# Patient Record
Sex: Male | Born: 1960 | Race: White | Hispanic: No | Marital: Married | Smoking: Former smoker
Health system: Southern US, Community
[De-identification: ages and names within clinical notes are randomized; demographics above are authoritative.]

## PROBLEM LIST (undated history)

## (undated) DIAGNOSIS — F1910 Other psychoactive substance abuse, uncomplicated: Secondary | ICD-10-CM

## (undated) DIAGNOSIS — I213 ST elevation (STEMI) myocardial infarction of unspecified site: Secondary | ICD-10-CM

## (undated) DIAGNOSIS — M1990 Unspecified osteoarthritis, unspecified site: Secondary | ICD-10-CM

## (undated) DIAGNOSIS — N200 Calculus of kidney: Secondary | ICD-10-CM

## (undated) DIAGNOSIS — I10 Essential (primary) hypertension: Secondary | ICD-10-CM

## (undated) DIAGNOSIS — E785 Hyperlipidemia, unspecified: Secondary | ICD-10-CM

## (undated) HISTORY — DX: Other psychoactive substance abuse, uncomplicated: F19.10

## (undated) HISTORY — DX: Calculus of kidney: N20.0

---

## 2002-04-20 ENCOUNTER — Emergency Department (HOSPITAL_COMMUNITY): Admission: EM | Admit: 2002-04-20 | Discharge: 2002-04-20 | Payer: Self-pay | Admitting: Emergency Medicine

## 2002-04-24 ENCOUNTER — Emergency Department (HOSPITAL_COMMUNITY): Admission: EM | Admit: 2002-04-24 | Discharge: 2002-04-24 | Payer: Self-pay | Admitting: Emergency Medicine

## 2002-04-29 ENCOUNTER — Emergency Department (HOSPITAL_COMMUNITY): Admission: EM | Admit: 2002-04-29 | Discharge: 2002-04-29 | Payer: Self-pay | Admitting: Emergency Medicine

## 2004-07-01 ENCOUNTER — Emergency Department (HOSPITAL_COMMUNITY): Admission: EM | Admit: 2004-07-01 | Discharge: 2004-07-01 | Payer: Self-pay | Admitting: Emergency Medicine

## 2006-11-12 ENCOUNTER — Emergency Department (HOSPITAL_COMMUNITY): Admission: EM | Admit: 2006-11-12 | Discharge: 2006-11-12 | Payer: Self-pay | Admitting: Emergency Medicine

## 2008-09-09 ENCOUNTER — Emergency Department (HOSPITAL_COMMUNITY): Admission: EM | Admit: 2008-09-09 | Discharge: 2008-09-09 | Payer: Self-pay | Admitting: Emergency Medicine

## 2008-10-26 ENCOUNTER — Ambulatory Visit: Payer: Self-pay | Admitting: Family Medicine

## 2008-10-26 DIAGNOSIS — M545 Low back pain: Secondary | ICD-10-CM

## 2008-10-26 LAB — CONVERTED CEMR LAB
ALT: 16 units/L (ref 0–53)
AST: 14 units/L (ref 0–37)
Albumin: 4.2 g/dL (ref 3.5–5.2)
Alkaline Phosphatase: 55 units/L (ref 39–117)
BUN: 15 mg/dL (ref 6–23)
Basophils Absolute: 0 10*3/uL (ref 0.0–0.1)
Basophils Relative: 0 % (ref 0–1)
CO2: 24 meq/L (ref 19–32)
Calcium: 8.6 mg/dL (ref 8.4–10.5)
Chloride: 105 meq/L (ref 96–112)
Creatinine, Ser: 0.96 mg/dL (ref 0.40–1.50)
Eosinophils Absolute: 0.3 10*3/uL (ref 0.0–0.7)
Eosinophils Relative: 3 % (ref 0–5)
Glucose, Bld: 84 mg/dL (ref 70–99)
HCT: 47.1 % (ref 39.0–52.0)
Hemoglobin: 15.5 g/dL (ref 13.0–17.0)
Lymphocytes Relative: 40 % (ref 12–46)
Lymphs Abs: 3.6 10*3/uL (ref 0.7–4.0)
MCHC: 32.9 g/dL (ref 30.0–36.0)
MCV: 90.8 fL (ref 78.0–100.0)
Monocytes Absolute: 0.7 10*3/uL (ref 0.1–1.0)
Monocytes Relative: 7 % (ref 3–12)
Neutro Abs: 4.5 10*3/uL (ref 1.7–7.7)
Neutrophils Relative %: 50 % (ref 43–77)
Platelets: 216 10*3/uL (ref 150–400)
Potassium: 5 meq/L (ref 3.5–5.3)
RBC: 5.19 M/uL (ref 4.22–5.81)
RDW: 14.4 % (ref 11.5–15.5)
Sodium: 143 meq/L (ref 135–145)
Total Bilirubin: 0.4 mg/dL (ref 0.3–1.2)
Total Protein: 6.7 g/dL (ref 6.0–8.3)
Uric Acid, Serum: 5.3 mg/dL (ref 4.0–7.8)
WBC: 9 10*3/uL (ref 4.0–10.5)

## 2008-10-27 ENCOUNTER — Encounter (INDEPENDENT_AMBULATORY_CARE_PROVIDER_SITE_OTHER): Payer: Self-pay | Admitting: Family Medicine

## 2008-10-28 ENCOUNTER — Ambulatory Visit (HOSPITAL_COMMUNITY): Admission: RE | Admit: 2008-10-28 | Discharge: 2008-10-28 | Payer: Self-pay | Admitting: Family Medicine

## 2008-11-01 ENCOUNTER — Ambulatory Visit: Payer: Self-pay | Admitting: *Deleted

## 2008-11-07 ENCOUNTER — Telehealth (INDEPENDENT_AMBULATORY_CARE_PROVIDER_SITE_OTHER): Payer: Self-pay | Admitting: Family Medicine

## 2008-11-11 ENCOUNTER — Encounter (INDEPENDENT_AMBULATORY_CARE_PROVIDER_SITE_OTHER): Payer: Self-pay | Admitting: Family Medicine

## 2009-02-01 ENCOUNTER — Encounter (INDEPENDENT_AMBULATORY_CARE_PROVIDER_SITE_OTHER): Payer: Self-pay | Admitting: Internal Medicine

## 2010-01-10 ENCOUNTER — Emergency Department (HOSPITAL_COMMUNITY): Admission: EM | Admit: 2010-01-10 | Discharge: 2010-01-10 | Payer: Self-pay | Admitting: Emergency Medicine

## 2010-08-15 NOTE — Letter (Signed)
Summary: [facility]  [facility]   Imported By: Arta Joon Pohle 03/07/2010 12:41:58  _____________________________________________________________________  External Attachment:    Type:   Image     Comment:   External Document

## 2010-10-01 LAB — BASIC METABOLIC PANEL
BUN: 13 mg/dL (ref 6–23)
CO2: 26 mEq/L (ref 19–32)
Calcium: 9.1 mg/dL (ref 8.4–10.5)
Chloride: 104 mEq/L (ref 96–112)
Creatinine, Ser: 1.24 mg/dL (ref 0.4–1.5)
GFR calc Af Amer: >60 mL/min (ref >60)
GFR calc non Af Amer: >60 mL/min (ref >60)
Glucose, Bld: 111 mg/dL — ABNORMAL HIGH (ref 70–99)
Potassium: 3.9 mEq/L (ref 3.5–5.1)
Sodium: 139 mEq/L (ref 135–145)

## 2010-10-01 LAB — CBC
HCT: 45.3 % (ref 39.0–52.0)
Hemoglobin: 15.4 g/dL (ref 13.0–17.0)
MCH: 30.5 pg (ref 26.0–34.0)
MCHC: 34 g/dL (ref 30.0–36.0)
MCV: 89.9 fL (ref 78.0–100.0)
Platelets: 198 10*3/uL (ref 150–400)
RBC: 5.05 MIL/uL (ref 4.22–5.81)
RDW: 13.1 % (ref 11.5–15.5)
WBC: 12 10*3/uL — ABNORMAL HIGH (ref 4.0–10.5)

## 2010-10-01 LAB — DIFFERENTIAL
Basophils Absolute: 0 10*3/uL (ref 0.0–0.1)
Basophils Relative: 0 % (ref 0–1)
Eosinophils Absolute: 0.1 10*3/uL (ref 0.0–0.7)
Eosinophils Relative: 1 % (ref 0–5)
Lymphocytes Relative: 15 % (ref 12–46)
Lymphs Abs: 1.8 10*3/uL (ref 0.7–4.0)
Monocytes Absolute: 0.9 10*3/uL (ref 0.1–1.0)
Monocytes Relative: 8 % (ref 3–12)
Neutro Abs: 9.2 10*3/uL — ABNORMAL HIGH (ref 1.7–7.7)
Neutrophils Relative %: 77 % (ref 43–77)

## 2010-10-01 LAB — URINALYSIS, ROUTINE W REFLEX MICROSCOPIC
Bilirubin Urine: NEGATIVE
Glucose, UA: NEGATIVE mg/dL
Ketones, ur: NEGATIVE mg/dL
Leukocytes, UA: NEGATIVE
Nitrite: NEGATIVE
Protein, ur: NEGATIVE mg/dL
Specific Gravity, Urine: 1.023 (ref 1.005–1.030)
Urobilinogen, UA: 0.2 mg/dL (ref 0.0–1.0)
pH: 7 (ref 5.0–8.0)

## 2010-10-01 LAB — URINE MICROSCOPIC-ADD ON

## 2010-11-24 ENCOUNTER — Emergency Department (HOSPITAL_COMMUNITY)
Admission: EM | Admit: 2010-11-24 | Discharge: 2010-11-24 | Disposition: A | Discharge status: Home / Self Care | Payer: No Typology Code available for payment source | Attending: Emergency Medicine | Admitting: Emergency Medicine

## 2010-11-24 ENCOUNTER — Emergency Department (HOSPITAL_COMMUNITY): Payer: No Typology Code available for payment source

## 2010-11-24 DIAGNOSIS — M79609 Pain in unspecified limb: Secondary | ICD-10-CM | POA: Insufficient documentation

## 2014-08-23 ENCOUNTER — Emergency Department (HOSPITAL_COMMUNITY): Payer: Self-pay | Admitting: Certified Registered Nurse Anesthetist

## 2014-08-23 ENCOUNTER — Emergency Department (HOSPITAL_COMMUNITY)
Admission: EM | Admit: 2014-08-23 | Discharge: 2014-08-23 | Disposition: A | Discharge status: Home / Self Care | Payer: Self-pay | Attending: Emergency Medicine | Admitting: Emergency Medicine

## 2014-08-23 ENCOUNTER — Encounter (HOSPITAL_COMMUNITY)
Admission: EM | Disposition: A | Discharge status: Home / Self Care | Payer: Self-pay | Source: Home / Self Care | Attending: Emergency Medicine

## 2014-08-23 ENCOUNTER — Encounter (HOSPITAL_COMMUNITY): Payer: Self-pay | Admitting: Emergency Medicine

## 2014-08-23 ENCOUNTER — Emergency Department (HOSPITAL_COMMUNITY): Payer: Self-pay

## 2014-08-23 DIAGNOSIS — Y929 Unspecified place or not applicable: Secondary | ICD-10-CM | POA: Insufficient documentation

## 2014-08-23 DIAGNOSIS — Z885 Allergy status to narcotic agent status: Secondary | ICD-10-CM | POA: Insufficient documentation

## 2014-08-23 DIAGNOSIS — F17210 Nicotine dependence, cigarettes, uncomplicated: Secondary | ICD-10-CM | POA: Insufficient documentation

## 2014-08-23 DIAGNOSIS — W312XXA Contact with powered woodworking and forming machines, initial encounter: Secondary | ICD-10-CM | POA: Insufficient documentation

## 2014-08-23 DIAGNOSIS — S62624B Displaced fracture of medial phalanx of right ring finger, initial encounter for open fracture: Secondary | ICD-10-CM | POA: Insufficient documentation

## 2014-08-23 DIAGNOSIS — S62521B Displaced fracture of distal phalanx of right thumb, initial encounter for open fracture: Secondary | ICD-10-CM | POA: Insufficient documentation

## 2014-08-23 DIAGNOSIS — S68120A Partial traumatic metacarpophalangeal amputation of right index finger, initial encounter: Secondary | ICD-10-CM | POA: Insufficient documentation

## 2014-08-23 DIAGNOSIS — S62609B Fracture of unspecified phalanx of unspecified finger, initial encounter for open fracture: Secondary | ICD-10-CM

## 2014-08-23 DIAGNOSIS — S62622B Displaced fracture of medial phalanx of right middle finger, initial encounter for open fracture: Secondary | ICD-10-CM | POA: Insufficient documentation

## 2014-08-23 HISTORY — PX: I&D EXTREMITY: SHX5045

## 2014-08-23 HISTORY — PX: NERVE, TENDON AND ARTERY REPAIR: SHX5695

## 2014-08-23 HISTORY — PX: OPEN REDUCTION INTERNAL FIXATION (ORIF) HAND: SHX5991

## 2014-08-23 LAB — CBC WITH DIFFERENTIAL/PLATELET
Basophils Absolute: 0 10*3/uL (ref 0.0–0.1)
Basophils Relative: 0 % (ref 0–1)
Eosinophils Absolute: 0.1 10*3/uL (ref 0.0–0.7)
Eosinophils Relative: 1 % (ref 0–5)
HCT: 45.7 % (ref 39.0–52.0)
HEMOGLOBIN: 15.2 g/dL (ref 13.0–17.0)
Lymphocytes Relative: 35 % (ref 12–46)
Lymphs Abs: 2.7 10*3/uL (ref 0.7–4.0)
MCH: 30.6 pg (ref 26.0–34.0)
MCHC: 33.3 g/dL (ref 30.0–36.0)
MCV: 92 fL (ref 78.0–100.0)
Monocytes Absolute: 0.6 10*3/uL (ref 0.1–1.0)
Monocytes Relative: 8 % (ref 3–12)
Neutro Abs: 4.3 10*3/uL (ref 1.7–7.7)
Neutrophils Relative %: 56 % (ref 43–77)
Platelets: 203 10*3/uL (ref 150–400)
RBC: 4.97 MIL/uL (ref 4.22–5.81)
RDW: 13 % (ref 11.5–15.5)
WBC: 7.8 10*3/uL (ref 4.0–10.5)

## 2014-08-23 LAB — BASIC METABOLIC PANEL
Anion gap: 8 (ref 5–15)
BUN: 13 mg/dL (ref 6–23)
CALCIUM: 8.5 mg/dL (ref 8.4–10.5)
CHLORIDE: 107 mmol/L (ref 96–112)
CO2: 22 mmol/L (ref 19–32)
Creatinine, Ser: 0.87 mg/dL (ref 0.50–1.35)
GFR calc Af Amer: >90 mL/min (ref >90)
GFR calc non Af Amer: >90 mL/min (ref >90)
Glucose, Bld: 96 mg/dL (ref 70–99)
Potassium: 5 mmol/L (ref 3.5–5.1)
Sodium: 137 mmol/L (ref 135–145)

## 2014-08-23 SURGERY — IRRIGATION AND DEBRIDEMENT EXTREMITY
Anesthesia: General | Site: Arm Lower | Laterality: Right

## 2014-08-23 MED ORDER — NEOSTIGMINE METHYLSULFATE 10 MG/10ML IV SOLN
INTRAVENOUS | Status: DC | PRN
  Administered 2014-08-23: 3 mg via INTRAVENOUS

## 2014-08-23 MED ORDER — TETANUS-DIPHTH-ACELL PERTUSSIS 5-2.5-18.5 LF-MCG/0.5 IM SUSP
0.5000 mL | Freq: Once | INTRAMUSCULAR | Status: AC
  Administered 2014-08-23: 0.5 mL via INTRAMUSCULAR
  Filled 2014-08-23: qty 0.5

## 2014-08-23 MED ORDER — SUCCINYLCHOLINE CHLORIDE 20 MG/ML IJ SOLN
INTRAMUSCULAR | Status: DC | PRN
  Administered 2014-08-23: 100 mg via INTRAVENOUS

## 2014-08-23 MED ORDER — MIDAZOLAM HCL 2 MG/2ML IJ SOLN
INTRAMUSCULAR | Status: AC
  Filled 2014-08-23: qty 2

## 2014-08-23 MED ORDER — ROCURONIUM BROMIDE 100 MG/10ML IV SOLN
INTRAVENOUS | Status: DC | PRN
  Administered 2014-08-23: 20 mg via INTRAVENOUS

## 2014-08-23 MED ORDER — GLYCOPYRROLATE 0.2 MG/ML IJ SOLN
INTRAMUSCULAR | Status: DC | PRN
  Administered 2014-08-23: 0.2 mg via INTRAVENOUS
  Administered 2014-08-23: 0.4 mg via INTRAVENOUS

## 2014-08-23 MED ORDER — SCOPOLAMINE 1 MG/3DAYS TD PT72
1.0000 | MEDICATED_PATCH | TRANSDERMAL | Status: DC
  Administered 2014-08-23: 1.5 mg via TRANSDERMAL

## 2014-08-23 MED ORDER — GLYCOPYRROLATE 0.2 MG/ML IJ SOLN
INTRAMUSCULAR | Status: AC
  Filled 2014-08-23: qty 2

## 2014-08-23 MED ORDER — ONDANSETRON HCL 4 MG/2ML IJ SOLN
4.0000 mg | Freq: Once | INTRAMUSCULAR | Status: AC
  Administered 2014-08-23: 4 mg via INTRAVENOUS
  Filled 2014-08-23: qty 2

## 2014-08-23 MED ORDER — KETOROLAC TROMETHAMINE 30 MG/ML IJ SOLN
INTRAMUSCULAR | Status: AC
  Filled 2014-08-23: qty 1

## 2014-08-23 MED ORDER — PHENYLEPHRINE HCL 10 MG/ML IJ SOLN
INTRAMUSCULAR | Status: DC | PRN
  Administered 2014-08-23: 40 ug via INTRAVENOUS

## 2014-08-23 MED ORDER — ONDANSETRON HCL 4 MG/2ML IJ SOLN
INTRAMUSCULAR | Status: AC
  Filled 2014-08-23: qty 2

## 2014-08-23 MED ORDER — PROMETHAZINE HCL 25 MG/ML IJ SOLN: 6.2500 mg | INTRAMUSCULAR | Status: DC | PRN

## 2014-08-23 MED ORDER — DOCUSATE SODIUM 100 MG PO CAPS: 100.0000 mg | ORAL_CAPSULE | Freq: Two times a day (BID) | ORAL | Status: DC

## 2014-08-23 MED ORDER — NEOSTIGMINE METHYLSULFATE 10 MG/10ML IV SOLN
INTRAVENOUS | Status: AC
  Filled 2014-08-23: qty 1

## 2014-08-23 MED ORDER — CEFAZOLIN SODIUM-DEXTROSE 2-3 GM-% IV SOLR
INTRAVENOUS | Status: AC
  Filled 2014-08-23: qty 50

## 2014-08-23 MED ORDER — OXYCODONE-ACETAMINOPHEN 10-325 MG PO TABS: 1.0000 | ORAL_TABLET | ORAL | Status: DC | PRN

## 2014-08-23 MED ORDER — CEFAZOLIN SODIUM-DEXTROSE 2-3 GM-% IV SOLR
INTRAVENOUS | Status: DC | PRN
  Administered 2014-08-23: 2 g via INTRAVENOUS

## 2014-08-23 MED ORDER — CEFAZOLIN SODIUM 1-5 GM-% IV SOLN
1.0000 g | Freq: Once | INTRAVENOUS | Status: AC
  Administered 2014-08-23: 1 g via INTRAVENOUS
  Filled 2014-08-23: qty 50

## 2014-08-23 MED ORDER — FENTANYL CITRATE 0.05 MG/ML IJ SOLN
INTRAMUSCULAR | Status: AC
  Filled 2014-08-23: qty 5

## 2014-08-23 MED ORDER — LACTATED RINGERS IV SOLN
INTRAVENOUS | Status: DC | PRN
  Administered 2014-08-23 (×2): via INTRAVENOUS

## 2014-08-23 MED ORDER — KETOROLAC TROMETHAMINE 30 MG/ML IJ SOLN: 30.0000 mg | Freq: Once | INTRAMUSCULAR | Status: DC | PRN

## 2014-08-23 MED ORDER — SCOPOLAMINE 1 MG/3DAYS TD PT72
MEDICATED_PATCH | TRANSDERMAL | Status: AC
  Administered 2014-08-23: 1.5 mg via TRANSDERMAL
  Filled 2014-08-23: qty 1

## 2014-08-23 MED ORDER — MORPHINE SULFATE 4 MG/ML IJ SOLN
4.0000 mg | Freq: Once | INTRAMUSCULAR | Status: AC
  Administered 2014-08-23: 4 mg via INTRAVENOUS
  Filled 2014-08-23: qty 1

## 2014-08-23 MED ORDER — PROMETHAZINE HCL 12.5 MG PO TABS: 12.5000 mg | ORAL_TABLET | Freq: Four times a day (QID) | ORAL | Status: DC | PRN

## 2014-08-23 MED ORDER — ONDANSETRON HCL 4 MG/2ML IJ SOLN
INTRAMUSCULAR | Status: DC | PRN
  Administered 2014-08-23: 4 mg via INTRAVENOUS

## 2014-08-23 MED ORDER — MIDAZOLAM HCL 5 MG/5ML IJ SOLN
INTRAMUSCULAR | Status: DC | PRN
  Administered 2014-08-23: 2 mg via INTRAVENOUS

## 2014-08-23 MED ORDER — FENTANYL CITRATE 0.05 MG/ML IJ SOLN
INTRAMUSCULAR | Status: DC | PRN
  Administered 2014-08-23 (×2): 50 ug via INTRAVENOUS
  Administered 2014-08-23: 100 ug via INTRAVENOUS

## 2014-08-23 MED ORDER — CEPHALEXIN 500 MG PO CAPS: 500.0000 mg | ORAL_CAPSULE | Freq: Four times a day (QID) | ORAL | Status: DC

## 2014-08-23 MED ORDER — BUPIVACAINE HCL (PF) 0.25 % IJ SOLN
INTRAMUSCULAR | Status: AC
  Filled 2014-08-23: qty 30

## 2014-08-23 MED ORDER — SODIUM CHLORIDE 0.9 % IR SOLN
Status: DC | PRN
  Administered 2014-08-23: 1000 mL

## 2014-08-23 MED ORDER — PROPOFOL 10 MG/ML IV BOLUS
INTRAVENOUS | Status: DC | PRN
  Administered 2014-08-23: 150 mg via INTRAVENOUS
  Administered 2014-08-23: 50 mg via INTRAVENOUS

## 2014-08-23 MED ORDER — LIDOCAINE HCL (CARDIAC) 20 MG/ML IV SOLN
INTRAVENOUS | Status: DC | PRN
  Administered 2014-08-23: 60 mg via INTRAVENOUS

## 2014-08-23 MED ORDER — FENTANYL CITRATE 0.05 MG/ML IJ SOLN
25.0000 ug | INTRAMUSCULAR | Status: DC | PRN
Start: 2014-08-23 — End: 2014-08-23

## 2014-08-23 MED ORDER — LACTATED RINGERS IV SOLN: INTRAVENOUS | Status: DC | PRN

## 2014-08-23 MED ORDER — MORPHINE SULFATE 4 MG/ML IJ SOLN
8.0000 mg | Freq: Once | INTRAMUSCULAR | Status: AC
  Administered 2014-08-23: 8 mg via INTRAVENOUS
  Filled 2014-08-23: qty 2

## 2014-08-23 SURGICAL SUPPLY — 77 items
APPLIER CLIP 9.375 SM OPEN (CLIP)
BAG DECANTER FOR FLEXI CONT (MISCELLANEOUS) IMPLANT
BANDAGE ELASTIC 3 VELCRO ST LF (GAUZE/BANDAGES/DRESSINGS) ×4 IMPLANT
BANDAGE ELASTIC 4 VELCRO ST LF (GAUZE/BANDAGES/DRESSINGS) ×4 IMPLANT
BNDG COHESIVE 1X5 TAN STRL LF (GAUZE/BANDAGES/DRESSINGS) IMPLANT
BNDG CONFORM 2 STRL LF (GAUZE/BANDAGES/DRESSINGS) ×4 IMPLANT
BNDG ELASTIC 2 VLCR STRL LF (GAUZE/BANDAGES/DRESSINGS) ×4 IMPLANT
BNDG ESMARK 4X9 LF (GAUZE/BANDAGES/DRESSINGS) ×4 IMPLANT
BNDG GAUZE ELAST 4 BULKY (GAUZE/BANDAGES/DRESSINGS) ×4 IMPLANT
CLIP APPLIE 9.375 SM OPEN (CLIP) IMPLANT
CORDS BIPOLAR (ELECTRODE) ×4 IMPLANT
COVER SURGICAL LIGHT HANDLE (MISCELLANEOUS) ×4 IMPLANT
CUFF TOURNIQUET SINGLE 18IN (TOURNIQUET CUFF) ×4 IMPLANT
CUFF TOURNIQUET SINGLE 24IN (TOURNIQUET CUFF) IMPLANT
DRAIN PENROSE 1/4X12 LTX STRL (WOUND CARE) IMPLANT
DRAPE OEC MINIVIEW 54X84 (DRAPES) ×4 IMPLANT
DRAPE SURG 17X23 STRL (DRAPES) ×4 IMPLANT
DRSG ADAPTIC 3X8 NADH LF (GAUZE/BANDAGES/DRESSINGS) IMPLANT
ELECT REM PT RETURN 9FT ADLT (ELECTROSURGICAL)
ELECTRODE REM PT RTRN 9FT ADLT (ELECTROSURGICAL) IMPLANT
GAUZE SPONGE 4X4 12PLY STRL (GAUZE/BANDAGES/DRESSINGS) ×4 IMPLANT
GAUZE XEROFORM 1X8 LF (GAUZE/BANDAGES/DRESSINGS) IMPLANT
GAUZE XEROFORM 5X9 LF (GAUZE/BANDAGES/DRESSINGS) ×4 IMPLANT
GEL ULTRASOUND 20GR AQUASONIC (MISCELLANEOUS) IMPLANT
GLOVE BIOGEL PI IND STRL 8.5 (GLOVE) ×2 IMPLANT
GLOVE BIOGEL PI INDICATOR 8.5 (GLOVE) ×2
GLOVE SURG ORTHO 8.0 STRL STRW (GLOVE) ×4 IMPLANT
GOWN STRL REUS W/ TWL LRG LVL3 (GOWN DISPOSABLE) ×2 IMPLANT
GOWN STRL REUS W/ TWL XL LVL3 (GOWN DISPOSABLE) ×2 IMPLANT
GOWN STRL REUS W/TWL LRG LVL3 (GOWN DISPOSABLE) ×2
GOWN STRL REUS W/TWL XL LVL3 (GOWN DISPOSABLE) ×2
HANDPIECE INTERPULSE COAX TIP (DISPOSABLE)
HYDROGEN PEROXIDE 16OZ (MISCELLANEOUS) ×4 IMPLANT
K-WIRE DBL TROCAR .035X4 ×8 IMPLANT
K-WIRE DBL TROCAR .045X4 ×4 IMPLANT
KIT BASIN OR (CUSTOM PROCEDURE TRAY) ×4 IMPLANT
KIT ROOM TURNOVER OR (KITS) ×4 IMPLANT
KWIRE DBL TROCAR .035X4 ×4 IMPLANT
KWIRE DBL TROCAR .045X4 ×2 IMPLANT
LOOP VESSEL MAXI BLUE (MISCELLANEOUS) IMPLANT
MANIFOLD NEPTUNE II (INSTRUMENTS) IMPLANT
NEEDLE HYPO 25GX1X1/2 BEV (NEEDLE) IMPLANT
NS IRRIG 1000ML POUR BTL (IV SOLUTION) ×4 IMPLANT
PACK ORTHO EXTREMITY (CUSTOM PROCEDURE TRAY) ×4 IMPLANT
PAD ARMBOARD 7.5X6 YLW CONV (MISCELLANEOUS) ×8 IMPLANT
PAD CAST 3X4 CTTN HI CHSV (CAST SUPPLIES) ×2 IMPLANT
PAD CAST 4YDX4 CTTN HI CHSV (CAST SUPPLIES) ×2 IMPLANT
PADDING CAST COTTON 3X4 STRL (CAST SUPPLIES) ×2
PADDING CAST COTTON 4X4 STRL (CAST SUPPLIES) ×2
SET HNDPC FAN SPRY TIP SCT (DISPOSABLE) IMPLANT
SOAP 2 % CHG 4 OZ (WOUND CARE) ×4 IMPLANT
SPEAR EYE SURG WECK-CEL (MISCELLANEOUS) IMPLANT
SPLINT FIBERGLASS 3X35 (CAST SUPPLIES) ×4 IMPLANT
SPONGE GAUZE 4X4 12PLY STER LF (GAUZE/BANDAGES/DRESSINGS) ×4 IMPLANT
SPONGE LAP 18X18 X RAY DECT (DISPOSABLE) ×4 IMPLANT
SPONGE LAP 4X18 X RAY DECT (DISPOSABLE) ×4 IMPLANT
SUCTION FRAZIER TIP 10 FR DISP (SUCTIONS) ×4 IMPLANT
SUT CHROMIC 5 0 P 3 (SUTURE) ×4 IMPLANT
SUT ETHILON 4 0 PS 2 18 (SUTURE) IMPLANT
SUT ETHILON 5 0 P 3 18 (SUTURE)
SUT FIBERWIRE 3-0 18 TAPR NDL (SUTURE) ×4
SUT FIBERWIRE 4-0 18 DIAM BLUE (SUTURE)
SUT MERSILENE 4 0 P 3 (SUTURE) IMPLANT
SUT NYLON ETHILON 5-0 P-3 1X18 (SUTURE) IMPLANT
SUT PROLENE 4 0 PS 2 18 (SUTURE) IMPLANT
SUT VICRYL RAPIDE 4/0 PS 2 (SUTURE) ×12 IMPLANT
SUTURE FIBERWR 3-0 18 TAPR NDL (SUTURE) ×2 IMPLANT
SUTURE FIBERWR 4-0 18 DIA BLUE (SUTURE) IMPLANT
SYR CONTROL 10ML LL (SYRINGE) IMPLANT
TOWEL OR 17X24 6PK STRL BLUE (TOWEL DISPOSABLE) ×4 IMPLANT
TOWEL OR 17X26 10 PK STRL BLUE (TOWEL DISPOSABLE) ×4 IMPLANT
TUBE ANAEROBIC SPECIMEN COL (MISCELLANEOUS) IMPLANT
TUBE CONNECTING 12'X1/4 (SUCTIONS) ×1
TUBE CONNECTING 12X1/4 (SUCTIONS) ×3 IMPLANT
UNDERPAD 30X30 INCONTINENT (UNDERPADS AND DIAPERS) ×4 IMPLANT
WATER STERILE IRR 1000ML POUR (IV SOLUTION) IMPLANT
YANKAUER SUCT BULB TIP NO VENT (SUCTIONS) ×4 IMPLANT

## 2014-08-23 NOTE — Brief Op Note (Cosign Needed)
08/23/2014  4:45 PM  PATIENT:  Robert Benson  54 y.o. male  PRE-OPERATIVE DIAGNOSIS:  RIGHT HAND INJURY  POST-OPERATIVE DIAGNOSIS:  * No post-op diagnosis entered *  PROCEDURE:  Procedure(s): EXPLORATION RIGHT HAND, THUMB INDEX, LONG AND RING FINGER (Right) NERVE, TENDON, BONE AND ARTERY REPAIR AS NECESSARY (Right) OPEN REDUCTION INTERNAL FIXATION (ORIF) HAND (Right)  SURGEON:  Surgeon(s) and Role:    * Sharma CovertFred W Valisa Karpel, MD - Primary  PHYSICIAN ASSISTANT:   ASSISTANTS: none   ANESTHESIA:   general  EBL:     BLOOD ADMINISTERED:none  DRAINS: none   LOCAL MEDICATIONS USED:  NONE  SPECIMEN:  No Specimen  DISPOSITION OF SPECIMEN:  N/A  COUNTS:  YES  TOURNIQUET:    DICTATION: .Other Dictation: Dictation Number [id number]  PLAN OF CARE: Discharge to home after PACU  PATIENT DISPOSITION:  PACU - hemodynamically stable.   Delay start of Pharmacological VTE agent (>24hrs) due to surgical blood loss or risk of bleeding: not applicable

## 2014-08-23 NOTE — Anesthesia Preprocedure Evaluation (Addendum)
Anesthesia Evaluation  Patient identified by MRN, date of birth, ID band Patient awake    Reviewed: Allergy & Precautions, NPO status , Patient's Chart, lab work & pertinent test results  Airway Mallampati: II  TM Distance: >3 FB Neck ROM: Full    Dental  (+) Poor Dentition, Loose   Pulmonary Current Smoker,  breath sounds clear to auscultation        Cardiovascular negative cardio ROS  Rhythm:Regular Rate:Normal     Neuro/Psych negative neurological ROS     GI/Hepatic negative GI ROS, Neg liver ROS,   Endo/Other  negative endocrine ROS  Renal/GU negative Renal ROS     Musculoskeletal   Abdominal   Peds  Hematology negative hematology ROS (+)   Anesthesia Other Findings   Reproductive/Obstetrics                           Anesthesia Physical Anesthesia Plan  ASA: II  Anesthesia Plan: General   Post-op Pain Management:    Induction: Intravenous and Rapid sequence  Airway Management Planned: Oral ETT  Additional Equipment:   Intra-op Plan:   Post-operative Plan:   Informed Consent: I have reviewed the patients History and Physical, chart, labs and discussed the procedure including the risks, benefits and alternatives for the proposed anesthesia with the patient or authorized representative who has indicated his/her understanding and acceptance.   Dental advisory given  Plan Discussed with: CRNA  Anesthesia Plan Comments:        Anesthesia Quick Evaluation

## 2014-08-23 NOTE — Transfer of Care (Signed)
Immediate Anesthesia Transfer of Care Note  Patient: Robert MaxwellJohn M Molinelli  Procedure(s) Performed: Procedure(s): EXPLORATION RIGHT HAND, THUMB INDEX, LONG AND RING FINGER, Index finger amputation, IP Fusion of Long Finger, ORIF of Thumb (Right) NERVE, TENDON, BONE AND ARTERY REPAIR AS NECESSARY (Right) OPEN REDUCTION INTERNAL FIXATION (ORIF) HAND (Right)  Patient Location: PACU  Anesthesia Type:General  Level of Consciousness: awake, alert , oriented and patient cooperative  Airway & Oxygen Therapy: Patient Spontanous Breathing and Patient connected to nasal cannula oxygen  Post-op Assessment: Report given to RN, Post -op Vital signs reviewed and stable and Patient moving all extremities  Post vital signs: Reviewed and stable  Last Vitals:  Filed Vitals:   08/23/14 1530  BP: 157/90  Pulse: 53  Temp:   Resp: 14    Complications: No apparent anesthesia complications

## 2014-08-23 NOTE — ED Provider Notes (Signed)
CSN: [id number]     Arrival date & time 08/23/14  1020 History   First MD Initiated Contact with Patient 08/23/14 1022     Chief Complaint  Patient presents with  . Hand Injury     (Consider location/radiation/quality/duration/timing/severity/associated sxs/prior Treatment) Patient is a 54 y.o. male presenting with hand injury. The history is provided by the patient. No language interpreter was used.  Hand Injury Location:  Hand and finger Injury: yes   Mechanism of injury comment:  Table saw Finger location:  R index finger, R middle finger, R ring finger and R thumb Pain details:    Quality:  Aching   Radiates to:  Does not radiate   Severity:  Moderate   Onset quality:  Sudden   Timing:  Constant   Progression:  Unchanged Chronicity:  New Handedness:  Right-handed Dislocation: no   Foreign body present:  No foreign bodies Tetanus status:  Out of date Prior injury to area:  No Relieved by:  Nothing Worsened by:  Nothing tried Ineffective treatments:  None tried Associated symptoms: numbness   Associated symptoms: no back pain, no decreased range of motion, no fever, no muscle weakness, no stiffness and no swelling     History reviewed. No pertinent past medical history. History reviewed. No pertinent past surgical history. History reviewed. No pertinent family history. History  Substance Use Topics  . Smoking status: Current Every Day Smoker  . Smokeless tobacco: Never Used  . Alcohol Use: No    Review of Systems  Constitutional: Negative for fever and chills.  Respiratory: Negative for cough and shortness of breath.   Gastrointestinal: Negative for nausea and vomiting.  Genitourinary: Negative for dysuria, urgency and frequency.  Musculoskeletal: Negative for back pain and stiffness.  Skin: Positive for wound (laceration of R hand). Negative for color change and rash.  Neurological: Negative for weakness and numbness.  All other systems reviewed and are  negative.     Allergies  Review of patient's allergies indicates no known allergies.  Home Medications   Prior to Admission medications   Not on File   BP 132/76 mmHg  Pulse 57  Temp(Src) 97.9 F (36.6 C) (Oral)  Resp 17  Ht 5\' 7"  (1.702 m)  Wt 190 lb (86.183 kg)  BMI 29.75 kg/m2  SpO2 98% Physical Exam  Constitutional: He is oriented to person, place, and time. He appears well-developed and well-nourished. No distress.  HENT:  Head: Normocephalic and atraumatic.  Eyes: Pupils are equal, round, and reactive to light.  Neck: Normal range of motion.  Cardiovascular: Normal rate, regular rhythm and normal heart sounds.   Pulmonary/Chest: Effort normal and breath sounds normal. No respiratory distress. He has no decreased breath sounds. He has no wheezes. He has no rhonchi. He has no rales.  Abdominal: Soft. He exhibits no distension. There is no tenderness. There is no rebound and no guarding.  Musculoskeletal: He exhibits no edema.       Right hand: He exhibits decreased range of motion, tenderness, deformity and laceration. He exhibits no bony tenderness, normal capillary refill and no swelling.  Normal cap refill of the fingers of the R hand.  Decreased sensation in the 1st-3rd digits.  Flexion intact to 1st-4th digits.  See attached pictures for further exam findings.    Neurological: He is alert and oriented to person, place, and time. He exhibits normal muscle tone.  Skin: Skin is warm and dry.  Nursing note and vitals reviewed.  ED Course  Procedures (including critical care time) Labs Review Labs Reviewed  CBC WITH DIFFERENTIAL/PLATELET  BASIC METABOLIC PANEL    Imaging Review Dg Hand Complete Right  08/23/2014   CLINICAL DATA:  Lacerations due to table saw. Initial encounter.  EXAM: RIGHT HAND - COMPLETE 3+ VIEW  COMPARISON:  None.  FINDINGS: Open, comminuted fracturing of the first distal phalanx. There is no interphalangeal joint is extension.  Small densities in the neighboring soft tissues is likely bone fragments, cannot exclude punctate foreign bodies.  Open, comminuted fracturing of the second middle phalanx, with nondisplaced articular extension to the distal interphalangeal joint. Multiple bone fragments are present within the ventral digit soft tissues.  Open, comminuted fracture of the third middle phalanx with a medially displaced fragment involving the majority of the distal interphalangeal articular surface.  Open, nondisplaced fourth distal phalanx tuft fracture.  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  IMPRESSION: Open fractures of the first through fourth digits, as above.   Electronically Signed   By: Tiburcio Pea M.D.   On: 08/23/2014 11:27     EKG Interpretation None      MDM   Final diagnoses:  Open finger fracture, initial encounter   Patient is a 54 year old Caucasian male with no pertinent past medical history who comes to the emergency department today with an injury to his right hand from a table saw. Physical exam as above. Patient does have good cap refill distal to the injury with decreased sensation. His initial injury is concerning for an open fracture as a result he was treated with Ancef in the emergency department. Patient's Tdap was updated 5 years ago with his significant wound he was administered another Tdap.  X-ray of the hand demonstrated open fractures of the first through fourth digits of the right hand. Patient is right-hand dominant.  As a result his care was discussed with hand surgery. They felt the patient required an operation today and posted him to the OR. Patient was transferred from the emergency department to the operating room in a stable condition. His pain was controlled with morphine on the emergency department. Imaging reviewed by myself and considered in medical decision making. Imaging interpreted by radiology. Care was discussed with my attending  Dr. Micheline Maze.       Bethann Berkshire, MD 08/23/14 1610  Toy Cookey, MD 08/23/14 [phone]

## 2014-08-23 NOTE — Anesthesia Postprocedure Evaluation (Signed)
  Anesthesia Post-op Note  Patient: Robert MaxwellJohn M Benson  Procedure(s) Performed: Procedure(s): EXPLORATION RIGHT HAND, THUMB INDEX, LONG AND RING FINGER, Index finger amputation, IP Fusion of Long Finger, ORIF of Thumb (Right) NERVE, TENDON, BONE AND ARTERY REPAIR AS NECESSARY (Right) OPEN REDUCTION INTERNAL FIXATION (ORIF) HAND (Right)  Patient Location: PACU  Anesthesia Type:General  Level of Consciousness: awake and alert   Airway and Oxygen Therapy: Patient Spontanous Breathing  Post-op Pain: none  Post-op Assessment: Post-op Vital signs reviewed  Post-op Vital Signs: Reviewed  Last Vitals:  Filed Vitals:   08/23/14 2030  BP: 142/78  Pulse: 82  Temp: 36.4 C  Resp: 9    Complications: No apparent anesthesia complications

## 2014-08-23 NOTE — H&P (Signed)
Robert Benson is an 54 y.o. male.   Chief Complaint: right hand table saw injury HPI: PT WORKING AT HOME, SUSTAINED OPEN INJURY TO RIGHT HAND PRESENTED TO ED WITH OPEN WOUNDS TO RIGHT HAND NO PRIOR SURGERY OR INJURY TO RIGHT HAND RHD TD UTD IN ED   History reviewed. No pertinent past medical history.  History reviewed. No pertinent past surgical history.  History reviewed. No pertinent family history. Social History:  reports that he has been smoking.  He has never used smokeless tobacco. He reports that he uses illicit drugs (Marijuana). He reports that he does not drink alcohol.  Allergies:  Allergies  Allergen Reactions  . Morphine And Related Nausea And Vomiting    Medications Prior to Admission  Medication Sig Dispense Refill  . Aspirin-Salicylamide-Caffeine (BC HEADACHE POWDER PO) Take 1 packet by mouth as needed (headache).      Results for orders placed or performed during the hospital encounter of 08/23/14 (from the past 48 hour(s))  CBC with Differential     Status: None   Collection Time: 08/23/14 11:51 AM  Result Value Ref Range   WBC 7.8 4.0 - 10.5 K/uL   RBC 4.97 4.22 - 5.81 MIL/uL   Hemoglobin 15.2 13.0 - 17.0 g/dL   HCT 45.7 39.0 - 52.0 %   MCV 92.0 78.0 - 100.0 fL   MCH 30.6 26.0 - 34.0 pg   MCHC 33.3 30.0 - 36.0 g/dL   RDW 13.0 11.5 - 15.5 %   Platelets 203 150 - 400 K/uL   Neutrophils Relative % 56 43 - 77 %   Neutro Abs 4.3 1.7 - 7.7 K/uL   Lymphocytes Relative 35 12 - 46 %   Lymphs Abs 2.7 0.7 - 4.0 K/uL   Monocytes Relative 8 3 - 12 %   Monocytes Absolute 0.6 0.1 - 1.0 K/uL   Eosinophils Relative 1 0 - 5 %   Eosinophils Absolute 0.1 0.0 - 0.7 K/uL   Basophils Relative 0 0 - 1 %   Basophils Absolute 0.0 0.0 - 0.1 K/uL  Basic metabolic panel     Status: None   Collection Time: 08/23/14 11:51 AM  Result Value Ref Range   Sodium 137 135 - 145 mmol/L   Potassium 5.0 3.5 - 5.1 mmol/L   Chloride 107 96 - 112 mmol/L   CO2 22 19 - 32 mmol/L   Glucose, Bld 96 70 - 99 mg/dL   BUN 13 6 - 23 mg/dL   Creatinine, Ser 0.87 0.50 - 1.35 mg/dL   Calcium 8.5 8.4 - 10.5 mg/dL   GFR calc non Af Amer >90 >90 mL/min   GFR calc Af Amer >90 >90 mL/min    Comment: (NOTE) The eGFR has been calculated using the CKD EPI equation. This calculation has not been validated in all clinical situations. eGFR's persistently <90 mL/min signify possible Chronic Kidney Disease.    Anion gap 8 5 - 15   Dg Hand Complete Right  08/23/2014   CLINICAL DATA:  Lacerations due to table saw. Initial encounter.  EXAM: RIGHT HAND - COMPLETE 3+ VIEW  COMPARISON:  None.  FINDINGS: Open, comminuted fracturing of the first distal phalanx. There is no interphalangeal joint is extension. Small densities in the neighboring soft tissues is likely bone fragments, cannot exclude punctate foreign bodies.  Open, comminuted fracturing of the second middle phalanx, with nondisplaced articular extension to the distal interphalangeal joint. Multiple bone fragments are present within the ventral digit soft tissues.  Open, comminuted fracture of the third middle phalanx with a medially displaced fragment involving the majority of the distal interphalangeal articular surface.  Open, nondisplaced fourth distal phalanx tuft fracture.  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  IMPRESSION: Open fractures of the first through fourth digits, as above.   Electronically Signed   By: Jorje Guild M.D.   On: 08/23/2014 11:27    ROS NO RECENT ILLNESSES OR HOSPITALIZATIONS  Blood pressure 157/90, pulse 53, temperature 97.9 F (36.6 C), temperature source Oral, resp. rate 14, height '5\' 7"'  (1.702 m), weight 86.183 kg (190 lb), SpO2 98 %. Physical Exam  General Appearance:  Alert, cooperative, no distress, appears stated age  Head:  Normocephalic, without obvious abnormality, atraumatic  Eyes:  Pupils equal, conjunctiva/corneas clear,         Throat: Lips, mucosa, and  tongue normal; teeth and gums normal  Neck: No visible masses     Lungs:   respirations unlabored  Chest Wall:  No tenderness or deformity  Heart:  Regular rate and rhythm,  Abdomen:   Soft, non-tender,         Extremities: RIGHT HAND: VOLAR WOUND VERY DISTAL TIP OF THUMB THUMB TIP PERFUSED ABLE TO FLEX THUMB IP JOINT INDEX SEVERAL CM WOUND DORSUM OF P2 REGION FINGER TIP PERFUSED DORSAL WOUND TO LONG AND RING FINGERS MORE DISTAL FINGERS WARM WELL PERFUSED NO WOUND TO SMALL FINGER  Pulses: 2+ and symmetric  Skin: Skin color, texture, turgor normal, no rashes or lesions     Neurologic: Normal    Assessment/Plan RIGHT THUMB,INDEX/LONG/RING FINGER TABLE SAW INJURIES WITH OPEN FRACTURES  RIGHT THUMB /INDEX/LONG/RING FINGER DEBRIDEMENTS AND OPEN REPAIR AND OR RECONSTRUCTION  R/B/A DISCUSSED WITH PT IN ED.  PT VOICED UNDERSTANDING OF PLAN CONSENT SIGNED DAY OF SURGERY PT SEEN AND EXAMINED PRIOR TO OPERATIVE PROCEDURE/DAY OF SURGERY SITE MARKED. QUESTIONS ANSWERED WILL GO HOME FOLLOWING SURGERY  WE ARE PLANNING SURGERY FOR YOUR UPPER EXTREMITY. THE RISKS AND BENEFITS OF SURGERY INCLUDE BUT NOT LIMITED TO BLEEDING INFECTION, DAMAGE TO NEARBY NERVES ARTERIES TENDONS, FAILURE OF SURGERY TO ACCOMPLISH ITS INTENDED GOALS, PERSISTENT SYMPTOMS AND NEED FOR FURTHER SURGICAL INTERVENTION. WITH THIS IN MIND WE WILL PROCEED. I HAVE DISCUSSED WITH THE PATIENT THE PRE AND POSTOPERATIVE REGIMEN AND THE DOS AND DON'TS. PT VOICED UNDERSTANDING AND INFORMED CONSENT SIGNED.  Linna Hoff 08/23/2014, 4:42 PM

## 2014-08-23 NOTE — ED Notes (Signed)
Pt was cutting wood for his wood stove with a table saw and cut his left thumb,index, and middle finger. Bleeding controlled on arrival to ed.

## 2014-08-23 NOTE — Anesthesia Procedure Notes (Signed)
Procedure Name: Intubation Date/Time: 08/23/2014 4:54 PM Performed by: Jerilee HohMUMM, Chloe Miyoshi N Pre-anesthesia Checklist: Patient identified, Emergency Drugs available, Suction available and Patient being monitored Patient Re-evaluated:Patient Re-evaluated prior to inductionOxygen Delivery Method: Circle system utilized Preoxygenation: Pre-oxygenation with 100% oxygen Intubation Type: IV induction, Rapid sequence and Cricoid Pressure applied Laryngoscope Size: Mac and 4 Grade View: Grade I Tube type: Oral Tube size: 7.5 mm Number of attempts: 1 Airway Equipment and Method: Stylet Placement Confirmation: ETT inserted through vocal cords under direct vision,  positive ETCO2 and breath sounds checked- equal and bilateral Secured at: 22 cm Tube secured with: Tape Dental Injury: Teeth and Oropharynx as per pre-operative assessment

## 2014-08-23 NOTE — ED Provider Notes (Signed)
     Medical screening examination/treatment/procedure(s) were conducted as a shared visit with resident-physician practitioner(s) and myself.  I personally evaluated the patient during the encounter.  Pt is a 54 y.o. male with pmhx as above presenting with complex lacerations/near amputations to multiple fingers of dominant hand as above after injury with table saw.  Pt found to have dec sensation distally. Hand surgery will take to OR for repair. Toy Cookey.    Megan Docherty, MD 08/26/14 2157

## 2014-08-23 NOTE — Progress Notes (Signed)
Dr. Melvyn Novasrtmann notified that R third finger with small to moderate amount of bleeding even with pressure applied. Wife was concern when discharge instructions was given. Dressing reinforced with gauze. Dr. Melvyn Novasrtmann said that it is okay. No new order given.

## 2014-08-23 NOTE — Progress Notes (Signed)
Patient started vomiting after arrival to short stay. Dr. Sampson GoonFitzgerald, R notified

## 2014-08-23 NOTE — Discharge Instructions (Signed)
KEEP BANDAGE CLEAN AND DRY °CALL OFFICE FOR F/U APPT [phone] IN 10 DAYS °KEEP HAND ELEVATED ABOVE HEART °OK TO APPLY ICE TO OPERATIVE AREA °CONTACT OFFICE IF ANY WORSENING PAIN OR CONCERNS. °

## 2014-08-24 ENCOUNTER — Encounter (HOSPITAL_COMMUNITY): Payer: Self-pay | Admitting: Orthopedic Surgery

## 2014-08-24 NOTE — Op Note (Signed)
NAME:  Robert Benson, Robert Benson NO.:  [id number]  MEDICAL RECORD NO.:  [id number]  LOCATION:  [facility]                         FACILITY:  [facility]  PHYSICIAN:  Madelynn Done, MD  DATE OF BIRTH:  [birth year]  DATE OF PROCEDURE:  08/23/2014 DATE OF DISCHARGE:  08/23/2014                              OPERATIVE REPORT   PREOPERATIVE DIAGNOSES:  Right hand table saw injury with right thumb open distal phalanx fracture and soft tissue injury as well as near- complete amputation of right index finger and right long finger open middle phalanx fracture involving the articular surface with soft tissue injury and right ring finger open wound and nail bed injury.  POSTOPERATIVE DIAGNOSES:  Right hand table saw injury with right thumb open distal phalanx fracture and soft tissue injury as well as near- complete amputation of right index finger and right long finger open middle phalanx fracture involving the articular surface with soft tissue injury and right ring finger open wound and nail bed injury.  ATTENDING PHYSICIAN:  Sharma Covert IV, MD, who scrubbed and present for the entire procedure.  ASSISTANT SURGEON:  None.  ANESTHESIA:  General via LMA.  SURGICAL INDICATIONS:  Robert Benson is a right-hand-dominant gentleman, who was at home, sustained an open injury to his right hand after a table saw injury.  The patient was seen and evaluated and recommended he undergo the above procedure.  Risks, benefits, and alternatives were discussed in detail with the patient and signed informed consent was obtained.  Risks include but not limited to bleeding, infection, damage to nearby nerves, arteries, or tendons, loss of motion of wrist and digits, incomplete relief of symptoms, nonunion, malunion, hardware failure, need for further surgical intervention.  DESCRIPTION OF PROCEDURE:  The patient was properly identified in the preoperative holding area and mark with a permanent marker made  on the right hand to indicate the correct operative site.  The patient was brought back to the operating room, placed supine on the anesthesia room table where the general anesthetic was administered.  The patient received preoperative antibiotics prior to skin incision.  Right upper extremity was then prepped and draped in normal sterile fashion.  Time- out was called.  Correct site was identified and procedure was then begun.  Attention was then turned to the right, the limb was then elevated using Esmarch exsanguination and tourniquet insufflated. Attention was then turned to the right ring finger, distal portion of the nail bed was then taken off the nail bed and then the wound was then thoroughly irrigated.  The nail bed repair was then done with 5-0 chromic suture.  Several small lacerations to the distal tip of the finger were also repaired with chromic suture.  Attention was then turned to the middle finger, where the traumatic laceration on the dorsal aspect of the finger was then extended proximally.  The patient had a near-complete amputation through the level of the distal interphalangeal joint.  The patient had lost the radial condyle to the distal interphalangeal joint.  The wound was then thoroughly irrigated, the extensor mechanism was lacerated all the way to the zone 1. Thorough wound irrigation  and debridement was then carried out, excisional debridement of the open fracture site was then carried out. After the open treatment of the intra-articular fracture, the distal interphalangeal was then carried out with partial bone excision. Following this, given the comminution, a small 3-0 FiberWire suture was placed around the middle phalanx using a cerclage wire.  I was then able to reduce the ulnar condyle onto the shaft.  Once I was able to do that, I was able to place 2 longitudinal K-wire, and I was able to remove them.  Given the high comminution, it was felt as  indicated for primary arthrodesis.  Knowing that there was still 1 condyle, I denuded the articular surface on both surfaces of the joint and I was able to perform primary arthrodesis with 2 longitudinal 0.035 K-wires.  The K- wires were cut beneath the skin.  A mini C-arm was used to confirm placement of the K-wires.  Following this, the extensor mechanism was then repaired with Vicryl suture over the distal interphalangeal fusion site.  Following this, the wound was thoroughly irrigated.  The traumatic laceration was then repaired using 4-0 Vicryl Rapide sutures. Attention was then turned to the index finger where the patient had a near-complete amputation of the index finger, the high degree of comminution of the middle phalanx.  This was not a reconstructible digit given the soft tissue disarray.  Given the index finger, revision amputation was then carried out.  The amputation was then completed. Local neurectomies were then carried out and the a small V-Y lengthening flap was then carried out to cover the soft tissue area, and this was then closed with 4-0 Vicryl Rapide suture.  Attention was then turned to the thumb where excisional debridement of skin, subcutaneous tissue, and bone was then carried out of the distal phalanx fracture.  This was done sharply with knife, rongeurs, and curettes.  Open reduction was then performed and a 0.045 K-wire was then placed longitudinally across the fracture site with good reduction and placement.  This confirmed using the mini C-arm.  This was placed across the interphalangeal joint for additional purchase.  The wound was then irrigated and traumatic laceration of about 3 cm was then repaired with 4-0 Vicryl Rapide suture.  After completion, the tourniquet was deflated.  There was good perfusion of the fingertips.  The K-wires of the ring and middle finger were then left beneath the skin.  The thumb was left proud.  Xeroform dressing and  sterile compressive bandage were then applied.  The patient was placed in a well-padded thumb spica splint, extubated, and taken to recovery room in good condition.  RADIOGRAPHIC INTERPRETATION:  AP and lateral views of the hand do show the K-wire fixation placed in the long and the thumb, there was good position in both planes.  POSTPROCEDURE PLAN:  The patient was discharged home, see him back in the office in approximately 8 days for x-rays.  We will leave him in the splint and then we will see him back around the 2-week mark for x-rays out of the splint and then we will coordinate his care with the therapy depending on the community resources.  Radiographs at each visit.  K- wires in the thumb for approximately 4 weeks, for the DIP fusion approximately 12 weeks.  The patient will likely have a scheduled procedure to undergo deep hardware removal for the middle finger.     Madelynn DoneFred W Visente Kirker IV, MD     FWO/MEDQ  D:  08/23/2014  T:  08/24/2014  Job:  [id number]

## 2014-09-21 ENCOUNTER — Encounter (HOSPITAL_COMMUNITY): Payer: Self-pay | Admitting: Orthopedic Surgery

## 2014-09-21 NOTE — OR Nursing (Signed)
Late entry, delay code documentation. 

## 2014-11-02 ENCOUNTER — Encounter (HOSPITAL_COMMUNITY): Payer: Self-pay | Admitting: *Deleted

## 2014-11-02 MED ORDER — CHLORHEXIDINE GLUCONATE 4 % EX LIQD
60.0000 mL | Freq: Once | CUTANEOUS | Status: DC
  Filled 2014-11-02: qty 60

## 2014-11-02 MED ORDER — CEFAZOLIN SODIUM-DEXTROSE 2-3 GM-% IV SOLR
2.0000 g | INTRAVENOUS | Status: DC
Start: 2014-11-03 — End: 2014-11-02

## 2014-11-03 ENCOUNTER — Encounter (HOSPITAL_COMMUNITY)
Admission: RE | Disposition: A | Discharge status: Home / Self Care | Payer: Self-pay | Source: Ambulatory Visit | Attending: Orthopedic Surgery

## 2014-11-03 ENCOUNTER — Ambulatory Visit (HOSPITAL_COMMUNITY): Payer: Self-pay | Admitting: Anesthesiology

## 2014-11-03 ENCOUNTER — Ambulatory Visit (HOSPITAL_COMMUNITY)
Admission: RE | Admit: 2014-11-03 | Discharge: 2014-11-03 | Disposition: A | Discharge status: Home / Self Care | Payer: Self-pay | Source: Ambulatory Visit | Attending: Orthopedic Surgery | Admitting: Orthopedic Surgery

## 2014-11-03 DIAGNOSIS — M1990 Unspecified osteoarthritis, unspecified site: Secondary | ICD-10-CM | POA: Insufficient documentation

## 2014-11-03 DIAGNOSIS — Z792 Long term (current) use of antibiotics: Secondary | ICD-10-CM | POA: Insufficient documentation

## 2014-11-03 DIAGNOSIS — Z79899 Other long term (current) drug therapy: Secondary | ICD-10-CM | POA: Insufficient documentation

## 2014-11-03 DIAGNOSIS — Z472 Encounter for removal of internal fixation device: Secondary | ICD-10-CM | POA: Insufficient documentation

## 2014-11-03 DIAGNOSIS — Z7982 Long term (current) use of aspirin: Secondary | ICD-10-CM | POA: Insufficient documentation

## 2014-11-03 DIAGNOSIS — F17200 Nicotine dependence, unspecified, uncomplicated: Secondary | ICD-10-CM | POA: Insufficient documentation

## 2014-11-03 DIAGNOSIS — S6721XD Crushing injury of right hand, subsequent encounter: Secondary | ICD-10-CM

## 2014-11-03 HISTORY — DX: Unspecified osteoarthritis, unspecified site: M19.90

## 2014-11-03 HISTORY — PX: HARDWARE REMOVAL: SHX979

## 2014-11-03 LAB — CBC
HCT: 47.6 % (ref 39.0–52.0)
HEMOGLOBIN: 16.1 g/dL (ref 13.0–17.0)
MCH: 30.3 pg (ref 26.0–34.0)
MCHC: 33.8 g/dL (ref 30.0–36.0)
MCV: 89.6 fL (ref 78.0–100.0)
Platelets: 197 10*3/uL (ref 150–400)
RBC: 5.31 MIL/uL (ref 4.22–5.81)
RDW: 13.2 % (ref 11.5–15.5)
WBC: 8.5 10*3/uL (ref 4.0–10.5)

## 2014-11-03 SURGERY — REMOVAL, HARDWARE
Anesthesia: Monitor Anesthesia Care | Site: Hand | Laterality: Right

## 2014-11-03 MED ORDER — CEFAZOLIN SODIUM-DEXTROSE 2-3 GM-% IV SOLR
2.0000 g | INTRAVENOUS | Status: AC
  Administered 2014-11-03: 2 g via INTRAVENOUS
  Filled 2014-11-03: qty 50

## 2014-11-03 MED ORDER — 0.9 % SODIUM CHLORIDE (POUR BTL) OPTIME
TOPICAL | Status: DC | PRN
  Administered 2014-11-03: 1000 mL

## 2014-11-03 MED ORDER — ONDANSETRON HCL 4 MG/2ML IJ SOLN
INTRAMUSCULAR | Status: AC
  Filled 2014-11-03: qty 2

## 2014-11-03 MED ORDER — BUPIVACAINE HCL (PF) 0.25 % IJ SOLN
INTRAMUSCULAR | Status: DC | PRN
  Administered 2014-11-03: 9 mL

## 2014-11-03 MED ORDER — CHLORHEXIDINE GLUCONATE 4 % EX LIQD
60.0000 mL | Freq: Once | CUTANEOUS | Status: DC
  Filled 2014-11-03: qty 60

## 2014-11-03 MED ORDER — LIDOCAINE HCL 1 % IJ SOLN
INTRAMUSCULAR | Status: DC | PRN
  Administered 2014-11-03: 9 mL

## 2014-11-03 MED ORDER — BUPIVACAINE HCL (PF) 0.25 % IJ SOLN
INTRAMUSCULAR | Status: AC
  Filled 2014-11-03: qty 30

## 2014-11-03 MED ORDER — LIDOCAINE HCL (CARDIAC) 20 MG/ML IV SOLN
INTRAVENOUS | Status: AC
  Filled 2014-11-03: qty 5

## 2014-11-03 MED ORDER — MIDAZOLAM HCL 2 MG/2ML IJ SOLN
INTRAMUSCULAR | Status: AC
  Filled 2014-11-03: qty 2

## 2014-11-03 MED ORDER — PROPOFOL INFUSION 10 MG/ML OPTIME
INTRAVENOUS | Status: DC | PRN
  Administered 2014-11-03: 75 ug/kg/min via INTRAVENOUS

## 2014-11-03 MED ORDER — FENTANYL CITRATE (PF) 250 MCG/5ML IJ SOLN
INTRAMUSCULAR | Status: AC
  Filled 2014-11-03: qty 5

## 2014-11-03 MED ORDER — PROPOFOL 10 MG/ML IV BOLUS
INTRAVENOUS | Status: AC
  Filled 2014-11-03: qty 20

## 2014-11-03 MED ORDER — LACTATED RINGERS IV SOLN
INTRAVENOUS | Status: DC | PRN
  Administered 2014-11-03: 17:00:00 via INTRAVENOUS

## 2014-11-03 MED ORDER — MIDAZOLAM HCL 5 MG/5ML IJ SOLN
INTRAMUSCULAR | Status: DC | PRN
  Administered 2014-11-03: 2 mg via INTRAVENOUS

## 2014-11-03 MED ORDER — FENTANYL CITRATE (PF) 100 MCG/2ML IJ SOLN
INTRAMUSCULAR | Status: DC | PRN
  Administered 2014-11-03: 150 ug via INTRAVENOUS

## 2014-11-03 MED ORDER — OXYCODONE-ACETAMINOPHEN 10-325 MG PO TABS: 20.0000 | ORAL_TABLET | ORAL | Status: DC | PRN

## 2014-11-03 MED ORDER — LACTATED RINGERS IV SOLN
INTRAVENOUS | Status: DC
  Administered 2014-11-03: 15:00:00 via INTRAVENOUS

## 2014-11-03 MED ORDER — LIDOCAINE HCL (PF) 1 % IJ SOLN
INTRAMUSCULAR | Status: AC
  Filled 2014-11-03: qty 30

## 2014-11-03 SURGICAL SUPPLY — 59 items
BANDAGE ELASTIC 3 VELCRO ST LF (GAUZE/BANDAGES/DRESSINGS) IMPLANT
BANDAGE ELASTIC 4 VELCRO ST LF (GAUZE/BANDAGES/DRESSINGS) IMPLANT
BLADE SURG ROTATE 9660 (MISCELLANEOUS) IMPLANT
BNDG COHESIVE 1X5 TAN STRL LF (GAUZE/BANDAGES/DRESSINGS) ×3 IMPLANT
BNDG CONFORM 3 STRL LF (GAUZE/BANDAGES/DRESSINGS) ×3 IMPLANT
BNDG ESMARK 4X9 LF (GAUZE/BANDAGES/DRESSINGS) IMPLANT
BNDG GAUZE ELAST 4 BULKY (GAUZE/BANDAGES/DRESSINGS) IMPLANT
CLOSURE WOUND 1/2 X4 (GAUZE/BANDAGES/DRESSINGS)
CORDS BIPOLAR (ELECTRODE) IMPLANT
COVER SURGICAL LIGHT HANDLE (MISCELLANEOUS) ×3 IMPLANT
CUFF TOURNIQUET SINGLE 18IN (TOURNIQUET CUFF) ×3 IMPLANT
CUFF TOURNIQUET SINGLE 24IN (TOURNIQUET CUFF) IMPLANT
DRAIN TLS ROUND 10FR (DRAIN) IMPLANT
DRAPE OEC MINIVIEW 54X84 (DRAPES) IMPLANT
DRAPE SURG 17X11 SM STRL (DRAPES) IMPLANT
DRSG ADAPTIC 3X8 NADH LF (GAUZE/BANDAGES/DRESSINGS) IMPLANT
ELECT REM PT RETURN 9FT ADLT (ELECTROSURGICAL) ×3
ELECTRODE REM PT RTRN 9FT ADLT (ELECTROSURGICAL) ×1 IMPLANT
GAUZE SPONGE 2X2 8PLY STRL LF (GAUZE/BANDAGES/DRESSINGS) ×1 IMPLANT
GAUZE SPONGE 4X4 12PLY STRL (GAUZE/BANDAGES/DRESSINGS) IMPLANT
GAUZE SPONGE 4X4 16PLY XRAY LF (GAUZE/BANDAGES/DRESSINGS) IMPLANT
GAUZE XEROFORM 1X8 LF (GAUZE/BANDAGES/DRESSINGS) ×3 IMPLANT
GLOVE BIOGEL PI IND STRL 6 (GLOVE) ×1 IMPLANT
GLOVE BIOGEL PI IND STRL 6.5 (GLOVE) ×1 IMPLANT
GLOVE BIOGEL PI IND STRL 8.5 (GLOVE) ×1 IMPLANT
GLOVE BIOGEL PI INDICATOR 6 (GLOVE) ×2
GLOVE BIOGEL PI INDICATOR 6.5 (GLOVE) ×2
GLOVE BIOGEL PI INDICATOR 8.5 (GLOVE) ×2
GLOVE SURG ORTHO 8.0 STRL STRW (GLOVE) ×3 IMPLANT
GLOVE SURG SS PI 6.5 STRL IVOR (GLOVE) ×3 IMPLANT
GOWN STRL REUS W/ TWL LRG LVL3 (GOWN DISPOSABLE) ×1 IMPLANT
GOWN STRL REUS W/ TWL XL LVL3 (GOWN DISPOSABLE) ×1 IMPLANT
GOWN STRL REUS W/TWL LRG LVL3 (GOWN DISPOSABLE) ×2
GOWN STRL REUS W/TWL XL LVL3 (GOWN DISPOSABLE) ×2
KIT BASIN OR (CUSTOM PROCEDURE TRAY) ×3 IMPLANT
KIT ROOM TURNOVER OR (KITS) ×3 IMPLANT
MANIFOLD NEPTUNE II (INSTRUMENTS) IMPLANT
NEEDLE 22X1 1/2 (OR ONLY) (NEEDLE) IMPLANT
NS IRRIG 1000ML POUR BTL (IV SOLUTION) ×3 IMPLANT
PACK ORTHO EXTREMITY (CUSTOM PROCEDURE TRAY) ×3 IMPLANT
PAD ARMBOARD 7.5X6 YLW CONV (MISCELLANEOUS) ×6 IMPLANT
PAD CAST 4YDX4 CTTN HI CHSV (CAST SUPPLIES) IMPLANT
PADDING CAST COTTON 4X4 STRL (CAST SUPPLIES)
SOAP 2 % CHG 4 OZ (WOUND CARE) ×3 IMPLANT
SPONGE GAUZE 2X2 STER 10/PKG (GAUZE/BANDAGES/DRESSINGS) ×2
SPONGE LAP 4X18 X RAY DECT (DISPOSABLE) IMPLANT
STRIP CLOSURE SKIN 1/2X4 (GAUZE/BANDAGES/DRESSINGS) IMPLANT
SUCTION FRAZIER TIP 10 FR DISP (SUCTIONS) IMPLANT
SUT ETHILON 4 0 PS 2 18 (SUTURE) IMPLANT
SUT MNCRL AB 4-0 PS2 18 (SUTURE) IMPLANT
SUT VIC AB 3-0 FS2 27 (SUTURE) IMPLANT
SYR CONTROL 10ML LL (SYRINGE) IMPLANT
SYSTEM CHEST DRAIN TLS 7FR (DRAIN) IMPLANT
TOWEL OR 17X24 6PK STRL BLUE (TOWEL DISPOSABLE) ×3 IMPLANT
TOWEL OR 17X26 10 PK STRL BLUE (TOWEL DISPOSABLE) IMPLANT
TUBE CONNECTING 12'X1/4 (SUCTIONS)
TUBE CONNECTING 12X1/4 (SUCTIONS) IMPLANT
WATER STERILE IRR 1000ML POUR (IV SOLUTION) IMPLANT
YANKAUER SUCT BULB TIP NO VENT (SUCTIONS) IMPLANT

## 2014-11-03 NOTE — Discharge Instructions (Signed)
KEEP BANDAGE CLEAN AND DRY CALL OFFICE FOR F/U APPT [phone] IN 13 DAYS KEEP HAND ELEVATED ABOVE HEART OK TO APPLY ICE TO OPERATIVE AREA CONTACT OFFICE IF ANY WORSENING PAIN OR CONCERNS.  

## 2014-11-03 NOTE — Brief Op Note (Signed)
11/03/2014  5:08 PM  PATIENT:  Robert Benson  54 y.o. male  PRE-OPERATIVE DIAGNOSIS:  RIGHT MIDDLE  FINGERRETAINED HARDWARE  POST-OPERATIVE DIAGNOSIS:  RIGHT MIDDLE  FINGERRETAINED HARDWARE  PROCEDURE:  Procedure(s): HARDWARE REMOVAL from middle finger (Right)  SURGEON:  Surgeon(s) and Role:    * Bradly BienenstockFred Ashaad Gaertner, MD - Primary  PHYSICIAN ASSISTANT:   ASSISTANTS: none   ANESTHESIA:   general  EBL:     BLOOD ADMINISTERED:none  DRAINS: none   LOCAL MEDICATIONS USED:  MARCAINE     SPECIMEN:  No Specimen  DISPOSITION OF SPECIMEN:  N/A  COUNTS:  YES  TOURNIQUET:    DICTATION: .Other Dictation: Dictation Number [id number]  PLAN OF CARE: Discharge to home after PACU  PATIENT DISPOSITION:  PACU - hemodynamically stable.   Delay start of Pharmacological VTE agent (>24hrs) due to surgical blood loss or risk of bleeding: not applicable

## 2014-11-03 NOTE — Anesthesia Preprocedure Evaluation (Addendum)
Anesthesia Evaluation  Patient identified by MRN, date of birth, ID band Patient awake    Reviewed: Allergy & Precautions, NPO status , Patient's Chart, lab work & pertinent test results  Airway Mallampati: II  TM Distance: >3 FB Neck ROM: Full    Dental  (+) Poor Dentition, Loose   Pulmonary Current Smoker,  breath sounds clear to auscultation        Cardiovascular negative cardio ROS  Rhythm:Regular Rate:Normal     Neuro/Psych negative neurological ROS     GI/Hepatic negative GI ROS, Neg liver ROS,   Endo/Other  negative endocrine ROS  Renal/GU      Musculoskeletal   Abdominal   Peds  Hematology negative hematology ROS (+)   Anesthesia Other Findings   Reproductive/Obstetrics                            Anesthesia Physical Anesthesia Plan  ASA: II  Anesthesia Plan: MAC   Post-op Pain Management:    Induction: Intravenous  Airway Management Planned: Natural Airway  Additional Equipment:   Intra-op Plan:   Post-operative Plan:   Informed Consent: I have reviewed the patients History and Physical, chart, labs and discussed the procedure including the risks, benefits and alternatives for the proposed anesthesia with the patient or authorized representative who has indicated his/her understanding and acceptance.     Plan Discussed with: CRNA and Surgeon  Anesthesia Plan Comments:         Anesthesia Quick Evaluation

## 2014-11-03 NOTE — H&P (Signed)
Robert Benson is an 54 y.o. male.   Chief Complaint: right long finger injury HPI: pt initially seen for injury to right hand Underwent reconstruction of hand Pt here for stage 2 removal of implants to right long finger  Past Medical History  Diagnosis Date  . Arthritis     Past Surgical History  Procedure Laterality Date  . I&d extremity Right 08/23/2014    Procedure: EXPLORATION RIGHT HAND, THUMB INDEX, LONG AND RING FINGER, Index finger amputation, IP Fusion of Long Finger, ORIF of Thumb;  Surgeon: Robert BienenstockFred Aalia Greulich, MD;  Location: [facility] OR;  Service: Orthopedics;  Laterality: Right;  . Nerve, tendon and artery repair Right 08/23/2014    Procedure: NERVE, TENDON, BONE AND ARTERY REPAIR AS NECESSARY;  Surgeon: Robert BienenstockFred Chianna Spirito, MD;  Location: [facility] OR;  Service: Orthopedics;  Laterality: Right;  . Open reduction internal fixation (orif) hand Right 08/23/2014    Procedure: OPEN REDUCTION INTERNAL FIXATION (ORIF) HAND;  Surgeon: Robert BienenstockFred Akiel Fennell, MD;  Location: [facility] OR;  Service: Orthopedics;  Laterality: Right;    No family history on file. Social History:  reports that he has been smoking.  He has never used smokeless tobacco. He reports that he uses illicit drugs (Marijuana). He reports that he does not drink alcohol.  Allergies:  Allergies  Allergen Reactions  . Morphine And Related Nausea And Vomiting    Medications Prior to Admission  Medication Sig Dispense Refill  . oxyCODONE-acetaminophen (PERCOCET) 10-325 MG per tablet Take 1 tablet by mouth every 4 (four) hours as needed for pain. 40 tablet 0  . cephALEXin (KEFLEX) 500 MG capsule Take 1 capsule (500 mg total) by mouth 4 (four) times daily. (Patient not taking: Reported on 10/28/2014) 40 capsule 0  . docusate sodium (COLACE) 100 MG capsule Take 1 capsule (100 mg total) by mouth 2 (two) times daily. (Patient not taking: Reported on 10/28/2014) 30 capsule 0  . promethazine (PHENERGAN) 12.5 MG tablet Take 1 tablet (12.5 mg total) by mouth every 6 (six)  hours as needed for nausea or vomiting. (Patient not taking: Reported on 10/28/2014) 30 tablet 0    Results for orders placed or performed during the Benson encounter of 11/03/14 (from the past 48 hour(s))  CBC     Status: None   Collection Time: 11/03/14  2:18 PM  Result Value Ref Range   WBC 8.5 4.0 - 10.5 K/uL   RBC 5.31 4.22 - 5.81 MIL/uL   Hemoglobin 16.1 13.0 - 17.0 g/dL   HCT 16.147.6 09.639.0 - 04.552.0 %   MCV 89.6 78.0 - 100.0 fL   MCH 30.3 26.0 - 34.0 pg   MCHC 33.8 30.0 - 36.0 g/dL   RDW 40.913.2 81.111.5 - 91.415.5 %   Platelets 197 150 - 400 K/uL   No results found.  ROS NO RECENT ILLNESSES OR HOSPITALIZATIONS  Blood pressure 110/85, pulse 77, temperature 98 F (36.7 C), temperature source Oral, resp. rate 18, height 5\' 6"  (1.676 m), weight 62.738 kg (138 lb 5 oz), SpO2 98 %. Physical Exam  General Appearance:  Alert, cooperative, no distress, appears stated age  Head:  Normocephalic, without obvious abnormality, atraumatic  Eyes:  Pupils equal, conjunctiva/corneas clear,         Throat: Lips, mucosa, and tongue normal; teeth and gums normal  Neck: No visible masses     Lungs:   respirations unlabored  Chest Wall:  No tenderness or deformity  Heart:  Regular rate and rhythm,  Abdomen:   Soft, non-tender,  Extremities: RIGHT HAND: FINGERS WARM WELL PERFUSED LIMITED MOTION TO LONG FINGER. GOOD MOBILITY TO RING AND SMALL FINGER  Pulses: 2+ and symmetric  Skin: Skin color, texture, turgor normal, no rashes or lesions     Neurologic: Normal    Assessment/Plan RIGHT LONG FINGER RETAINED DEEP IMPLANT  RIGHT LONG FINGER REMOVAL OF  DEEP HARDWARE  R/B/A DISCUSSED WITH PT IN OFFICE.  PT VOICED UNDERSTANDING OF PLAN CONSENT SIGNED DAY OF SURGERY PT SEEN AND EXAMINED PRIOR TO OPERATIVE PROCEDURE/DAY OF SURGERY SITE MARKED. QUESTIONS ANSWERED WILL Robert Benson FOLLOWING SURGERY  Robert Benson 11/03/2014, 5:06 PM

## 2014-11-03 NOTE — Transfer of Care (Signed)
Immediate Anesthesia Transfer of Care Note  Patient: Robert MaxwellJohn M Benson  Procedure(s) Performed: Procedure(s): HARDWARE REMOVAL from middle finger (Right)  Patient Location: PACU  Anesthesia Type:MAC  Level of Consciousness: awake, alert  and oriented  Airway & Oxygen Therapy: Patient Spontanous Breathing  Post-op Assessment: Report given to RN, Post -op Vital signs reviewed and stable and Patient moving all extremities  Post vital signs: Reviewed and stable  Last Vitals:  Filed Vitals:   11/03/14 1736  BP: 113/84  Pulse: 78  Temp: 36.6 C  Resp: 17    Complications: No apparent anesthesia complications

## 2014-11-03 NOTE — Progress Notes (Signed)
Dr.Ortmann at bedside to see patient, discharge instructions state to take 20 tablets every four hours as needed for pain with the Oxycodone/tylenol tabs, this was an error and changed on bothe the instructions sent home with patient and with the prescription itself.

## 2014-11-04 NOTE — Op Note (Signed)
NAMEarney Mallet:  Benson, Robert                  ACCOUNT NO.:  [id number]  MEDICAL RECORD NO.:  [id number]  LOCATION:                                FACILITY:  [facility]  PHYSICIAN:  Madelynn DoneFred W Naven Giambalvo IV, MD  DATE OF BIRTH:  21-Sep-1960  DATE OF PROCEDURE:  11/03/2014 DATE OF DISCHARGE:  11/03/2014                              OPERATIVE REPORT   PREOPERATIVE DIAGNOSIS:  Right long finger retained deep implants x2.  POSTOPERATIVE DIAGNOSIS:  Right long finger retained deep implants x2.  ATTENDING PHYSICIAN:  Madelynn DoneFred W Tamas Suen IV, MD, who was scrubbed and present for the entire procedure.  ASSISTANT SURGEON:  None.  ANESTHESIA:  1% Xylocaine, 0.25% Marcaine local block with IV sedation.  PROCEDURE: 1. Removal of deep implant, right long finger, and buried K-wires x2. 2. Radiographs 2 views of right long finger.  SURGICAL INDICATIONS:  Robert Benson is a 54 year old gentleman, who sustained an injury to his right hand.  The patient underwent reconstruction and is back here for a second stage procedure to remove the deep implants.  Risks, benefits,  and alternatives were discussed in detail with the patient.  Signed informed consent was obtained.  Risks include, but not limited to bleeding, infection, damage to nearby nerves, arteries, or tendons, loss of motion of wrist and digits, incomplete relief of symptoms, and need for further surgical intervention.  DESCRIPTION OF PROCEDURE:  The patient was identified in the preoperative holding area and marked with a permanent marker made on the right long finger to indicate correct operative site.  The patient was then brought back to the operating room, placed supine on the anesthesia room table, where the IV sedation was administered.  The patient tolerated this well.  Local anesthetic was administered.  Time-out was called, correct side was identified, and procedure was then begun.  The limb was elevated.  A small stab incision made directly on the  skin distally.  Deep dissection was carried down to the bone and 2 buried K- wires were then identified and these were carefully removed.  Removal of deep implants was then done.  The wound was then irrigated.  Xeroform dressing and sterile compressive bandage was applied.  The patient tolerated the procedure well.  The patient was taken to recovery room in good condition.  POSTPROCEDURE PLAN:  The patient was discharged to home and seen back in the office in approximately 2 weeks for wound check, x-rays and, then begin gradual use and activity of the finger.  Radiographs at the first visit.     Madelynn DoneFred W Xaiver Roskelley IV, MD     FWO/MEDQ  D:  11/03/2014  T:  11/04/2014  Job:  [id number]

## 2014-11-05 ENCOUNTER — Encounter (HOSPITAL_COMMUNITY): Payer: Self-pay | Admitting: Orthopedic Surgery

## 2014-11-09 NOTE — Anesthesia Postprocedure Evaluation (Signed)
  Anesthesia Post-op Note  Patient: Robert Benson  Procedure(s) Performed: Procedure(s): HARDWARE REMOVAL from middle finger (Right)  Patient Location: PACU  Anesthesia Type:General  Level of Consciousness: awake and alert   Airway and Oxygen Therapy: Patient Spontanous Breathing  Post-op Pain: none  Post-op Assessment: Post-op Vital signs reviewed  Post-op Vital Signs: stable  Last Vitals:  Filed Vitals:   11/03/14 1800  BP:   Pulse: 68  Temp: 36.7 C  Resp: 15    Complications: No apparent anesthesia complications

## 2018-03-29 ENCOUNTER — Encounter (HOSPITAL_COMMUNITY)
Admission: EM | Disposition: A | Discharge status: Home / Self Care | Payer: Self-pay | Source: Home / Self Care | Attending: Cardiology

## 2018-03-29 ENCOUNTER — Other Ambulatory Visit: Payer: Self-pay

## 2018-03-29 ENCOUNTER — Encounter (HOSPITAL_COMMUNITY): Payer: Self-pay | Admitting: Emergency Medicine

## 2018-03-29 ENCOUNTER — Other Ambulatory Visit (HOSPITAL_COMMUNITY): Payer: Self-pay

## 2018-03-29 ENCOUNTER — Emergency Department (HOSPITAL_COMMUNITY): Payer: Medicaid Other

## 2018-03-29 ENCOUNTER — Inpatient Hospital Stay (HOSPITAL_COMMUNITY)
Admission: EM | Admit: 2018-03-29 | Discharge: 2018-04-01 | DRG: 247 | Disposition: A | Discharge status: Home / Self Care | Payer: Medicaid Other | Attending: Cardiovascular Disease | Admitting: Cardiovascular Disease

## 2018-03-29 DIAGNOSIS — Z79891 Long term (current) use of opiate analgesic: Secondary | ICD-10-CM | POA: Diagnosis not present

## 2018-03-29 DIAGNOSIS — F17210 Nicotine dependence, cigarettes, uncomplicated: Secondary | ICD-10-CM | POA: Diagnosis present

## 2018-03-29 DIAGNOSIS — I472 Ventricular tachycardia: Secondary | ICD-10-CM | POA: Diagnosis present

## 2018-03-29 DIAGNOSIS — I2510 Atherosclerotic heart disease of native coronary artery without angina pectoris: Secondary | ICD-10-CM

## 2018-03-29 DIAGNOSIS — Z7982 Long term (current) use of aspirin: Secondary | ICD-10-CM

## 2018-03-29 DIAGNOSIS — I2111 ST elevation (STEMI) myocardial infarction involving right coronary artery: Secondary | ICD-10-CM | POA: Diagnosis present

## 2018-03-29 DIAGNOSIS — E785 Hyperlipidemia, unspecified: Secondary | ICD-10-CM

## 2018-03-29 DIAGNOSIS — Z720 Tobacco use: Secondary | ICD-10-CM

## 2018-03-29 DIAGNOSIS — Z89021 Acquired absence of right finger(s): Secondary | ICD-10-CM | POA: Diagnosis not present

## 2018-03-29 DIAGNOSIS — I119 Hypertensive heart disease without heart failure: Secondary | ICD-10-CM | POA: Diagnosis present

## 2018-03-29 DIAGNOSIS — Z955 Presence of coronary angioplasty implant and graft: Secondary | ICD-10-CM

## 2018-03-29 DIAGNOSIS — I255 Ischemic cardiomyopathy: Secondary | ICD-10-CM | POA: Diagnosis present

## 2018-03-29 DIAGNOSIS — R079 Chest pain, unspecified: Secondary | ICD-10-CM | POA: Diagnosis present

## 2018-03-29 DIAGNOSIS — Z9889 Other specified postprocedural states: Secondary | ICD-10-CM

## 2018-03-29 DIAGNOSIS — Z885 Allergy status to narcotic agent status: Secondary | ICD-10-CM

## 2018-03-29 DIAGNOSIS — Z79899 Other long term (current) drug therapy: Secondary | ICD-10-CM

## 2018-03-29 DIAGNOSIS — I10 Essential (primary) hypertension: Secondary | ICD-10-CM

## 2018-03-29 DIAGNOSIS — M1990 Unspecified osteoarthritis, unspecified site: Secondary | ICD-10-CM | POA: Diagnosis present

## 2018-03-29 HISTORY — DX: Hyperlipidemia, unspecified: E78.5

## 2018-03-29 HISTORY — PX: LEFT HEART CATH AND CORONARY ANGIOGRAPHY: CATH118249

## 2018-03-29 HISTORY — PX: CORONARY/GRAFT ACUTE MI REVASCULARIZATION: CATH118305

## 2018-03-29 HISTORY — DX: ST elevation (STEMI) myocardial infarction of unspecified site: I21.3

## 2018-03-29 HISTORY — DX: Essential (primary) hypertension: I10

## 2018-03-29 LAB — MRSA PCR SCREENING: MRSA BY PCR: NEGATIVE

## 2018-03-29 LAB — CBC
HCT: 44.2 % (ref 39.0–52.0)
HEMATOCRIT: 46.4 % (ref 39.0–52.0)
HEMOGLOBIN: 15.3 g/dL (ref 13.0–17.0)
HEMOGLOBIN: 14.5 g/dL (ref 13.0–17.0)
MCH: 30.1 pg (ref 26.0–34.0)
MCH: 30.3 pg (ref 26.0–34.0)
MCHC: 33 g/dL (ref 30.0–36.0)
MCHC: 32.8 g/dL (ref 30.0–36.0)
MCV: 91.3 fL (ref 78.0–100.0)
MCV: 92.5 fL (ref 78.0–100.0)
Platelets: 238 10*3/uL (ref 150–400)
Platelets: 218 10*3/uL (ref 150–400)
RBC: 5.08 MIL/uL (ref 4.22–5.81)
RBC: 4.78 MIL/uL (ref 4.22–5.81)
RDW: 12.8 % (ref 11.5–15.5)
RDW: 12.9 % (ref 11.5–15.5)
WBC: 19.1 10*3/uL — AB (ref 4.0–10.5)
WBC: 14.4 10*3/uL — AB (ref 4.0–10.5)

## 2018-03-29 LAB — COMPREHENSIVE METABOLIC PANEL
ALBUMIN: 4 g/dL (ref 3.5–5.0)
ALK PHOS: 71 U/L (ref 38–126)
ALT: 35 U/L (ref 0–44)
ALT: 63 U/L — ABNORMAL HIGH (ref 0–44)
ANION GAP: 12 (ref 5–15)
AST: 135 U/L — ABNORMAL HIGH (ref 15–41)
AST: 352 U/L — AB (ref 15–41)
Albumin: 3.7 g/dL (ref 3.5–5.0)
Alkaline Phosphatase: 70 U/L (ref 38–126)
Anion gap: 12 (ref 5–15)
BILIRUBIN TOTAL: 1 mg/dL (ref 0.3–1.2)
BUN: 10 mg/dL (ref 6–20)
BUN: 6 mg/dL (ref 6–20)
CALCIUM: 8.5 mg/dL — AB (ref 8.9–10.3)
CO2: 22 mmol/L (ref 22–32)
CO2: 19 mmol/L — AB (ref 22–32)
Calcium: 9.2 mg/dL (ref 8.9–10.3)
Chloride: 102 mmol/L (ref 98–111)
Chloride: 106 mmol/L (ref 98–111)
Creatinine, Ser: 0.74 mg/dL (ref 0.61–1.24)
Creatinine, Ser: 0.75 mg/dL (ref 0.61–1.24)
GFR calc Af Amer: >60 mL/min (ref >60)
GFR calc non Af Amer: >60 mL/min (ref >60)
GLUCOSE: 124 mg/dL — AB (ref 70–99)
Glucose, Bld: 139 mg/dL — ABNORMAL HIGH (ref 70–99)
POTASSIUM: 3.8 mmol/L (ref 3.5–5.1)
Potassium: 3.5 mmol/L (ref 3.5–5.1)
SODIUM: 137 mmol/L (ref 135–145)
Sodium: 136 mmol/L (ref 135–145)
TOTAL PROTEIN: 7.2 g/dL (ref 6.5–8.1)
Total Bilirubin: 0.9 mg/dL (ref 0.3–1.2)
Total Protein: 6.6 g/dL (ref 6.5–8.1)

## 2018-03-29 LAB — CBC WITH DIFFERENTIAL/PLATELET
BASOS PCT: 0 %
Basophils Absolute: 0 10*3/uL (ref 0.0–0.1)
EOS ABS: 0 10*3/uL (ref 0.0–0.7)
Eosinophils Relative: 0 %
HCT: 45.8 % (ref 39.0–52.0)
HEMOGLOBIN: 15.9 g/dL (ref 13.0–17.0)
Lymphocytes Relative: 8 %
Lymphs Abs: 1.3 10*3/uL (ref 0.7–4.0)
MCH: 31.3 pg (ref 26.0–34.0)
MCHC: 34.7 g/dL (ref 30.0–36.0)
MCV: 90.2 fL (ref 78.0–100.0)
MONOS PCT: 5 %
Monocytes Absolute: 0.9 10*3/uL (ref 0.1–1.0)
NEUTROS PCT: 87 %
Neutro Abs: 13.9 10*3/uL — ABNORMAL HIGH (ref 1.7–7.7)
PLATELETS: 229 10*3/uL (ref 150–400)
RBC: 5.08 MIL/uL (ref 4.22–5.81)
RDW: 13 % (ref 11.5–15.5)
WBC: 16.1 10*3/uL — AB (ref 4.0–10.5)

## 2018-03-29 LAB — PLATELET COUNT: Platelets: 208 10*3/uL (ref 150–400)

## 2018-03-29 LAB — LIPID PANEL
CHOLESTEROL: 134 mg/dL (ref 0–200)
HDL: 41 mg/dL (ref >40)
LDL Cholesterol: 88 mg/dL (ref 0–99)
TRIGLYCERIDES: 25 mg/dL (ref <150)
Total CHOL/HDL Ratio: 3.3 RATIO
VLDL: 5 mg/dL (ref 0–40)

## 2018-03-29 LAB — MAGNESIUM: Magnesium: 2 mg/dL (ref 1.7–2.4)

## 2018-03-29 LAB — HEMOGLOBIN A1C
Hgb A1c MFr Bld: 5.7 % — ABNORMAL HIGH (ref 4.8–5.6)
Mean Plasma Glucose: 116.89 mg/dL

## 2018-03-29 LAB — TROPONIN I
TROPONIN I: 14.77 ng/mL — AB (ref <0.03)
Troponin I: >65 ng/mL (ref <0.03)
Troponin I: >65 ng/mL (ref <0.03)

## 2018-03-29 LAB — POCT I-STAT TROPONIN I: Troponin i, poc: 14.14 ng/mL (ref 0.00–0.08)

## 2018-03-29 LAB — TSH: TSH: 0.541 u[IU]/mL (ref 0.350–4.500)

## 2018-03-29 LAB — PROTIME-INR
INR: 1.01
PROTHROMBIN TIME: 13.2 s (ref 11.4–15.2)

## 2018-03-29 LAB — APTT: aPTT: 32 seconds (ref 24–36)

## 2018-03-29 SURGERY — CORONARY/GRAFT ACUTE MI REVASCULARIZATION
Anesthesia: LOCAL

## 2018-03-29 MED ORDER — HEPARIN SODIUM (PORCINE) 1000 UNIT/ML IJ SOLN
INTRAMUSCULAR | Status: DC | PRN
  Administered 2018-03-29: 4000 [IU] via INTRAVENOUS

## 2018-03-29 MED ORDER — SODIUM CHLORIDE 0.9% FLUSH: 3.0000 mL | INTRAVENOUS | Status: DC | PRN

## 2018-03-29 MED ORDER — ASPIRIN 81 MG PO CHEW
324.0000 mg | CHEWABLE_TABLET | Freq: Once | ORAL | Status: AC
  Administered 2018-03-29: 324 mg via ORAL

## 2018-03-29 MED ORDER — TIROFIBAN HCL IN NACL 5-0.9 MG/100ML-% IV SOLN
INTRAVENOUS | Status: AC | PRN
  Administered 2018-03-29: 0.15 ug/kg/min via INTRAVENOUS

## 2018-03-29 MED ORDER — NITROGLYCERIN 1 MG/10 ML FOR IR/CATH LAB
INTRA_ARTERIAL | Status: AC
  Filled 2018-03-29: qty 10

## 2018-03-29 MED ORDER — SODIUM CHLORIDE 0.9 % WEIGHT BASED INFUSION: 1.0000 mL/kg/h | INTRAVENOUS | Status: AC

## 2018-03-29 MED ORDER — HEPARIN SODIUM (PORCINE) 1000 UNIT/ML IJ SOLN
INTRAMUSCULAR | Status: AC
  Filled 2018-03-29: qty 1

## 2018-03-29 MED ORDER — ONDANSETRON HCL 4 MG/2ML IJ SOLN
4.0000 mg | Freq: Four times a day (QID) | INTRAMUSCULAR | Status: DC | PRN
  Administered 2018-03-29 – 2018-03-30 (×2): 4 mg via INTRAVENOUS
  Filled 2018-03-29 (×2): qty 2

## 2018-03-29 MED ORDER — VERAPAMIL HCL 2.5 MG/ML IV SOLN
INTRAVENOUS | Status: DC | PRN
  Administered 2018-03-29: 10 mL via INTRA_ARTERIAL

## 2018-03-29 MED ORDER — ENOXAPARIN SODIUM 40 MG/0.4ML ~~LOC~~ SOLN: 40.0000 mg | SUBCUTANEOUS | Status: DC

## 2018-03-29 MED ORDER — SODIUM CHLORIDE 0.9% FLUSH
3.0000 mL | Freq: Two times a day (BID) | INTRAVENOUS | Status: DC
  Administered 2018-03-29: 3 mL via INTRAVENOUS
  Administered 2018-03-30: 10 mL via INTRAVENOUS
  Administered 2018-03-30 – 2018-04-01 (×3): 3 mL via INTRAVENOUS

## 2018-03-29 MED ORDER — HEPARIN (PORCINE) IN NACL 1000-0.9 UT/500ML-% IV SOLN
INTRAVENOUS | Status: AC
  Filled 2018-03-29: qty 1000

## 2018-03-29 MED ORDER — LIDOCAINE HCL (PF) 1 % IJ SOLN
INTRAMUSCULAR | Status: DC | PRN
  Administered 2018-03-29: 2 mL

## 2018-03-29 MED ORDER — HEPARIN SODIUM (PORCINE) 5000 UNIT/ML IJ SOLN
60.0000 [IU]/kg | Freq: Once | INTRAMUSCULAR | Status: AC
  Administered 2018-03-29: 3550 [IU] via INTRAVENOUS
  Filled 2018-03-29: qty 0.71

## 2018-03-29 MED ORDER — TIROFIBAN (AGGRASTAT) BOLUS VIA INFUSION
INTRAVENOUS | Status: DC | PRN
  Administered 2018-03-29: 1475 ug via INTRAVENOUS

## 2018-03-29 MED ORDER — ATORVASTATIN CALCIUM 80 MG PO TABS
80.0000 mg | ORAL_TABLET | Freq: Every day | ORAL | Status: DC
  Administered 2018-03-29 – 2018-03-31 (×3): 80 mg via ORAL
  Filled 2018-03-29 (×3): qty 1

## 2018-03-29 MED ORDER — SODIUM CHLORIDE 0.9 % IV SOLN
INTRAVENOUS | Status: DC
  Administered 2018-03-29: 20 mL/h via INTRAVENOUS

## 2018-03-29 MED ORDER — TIROFIBAN HCL IV 12.5 MG/250 ML
0.1500 ug/kg/min | INTRAVENOUS | Status: AC
  Administered 2018-03-29: 0.15 ug/kg/min via INTRAVENOUS
  Filled 2018-03-29: qty 250

## 2018-03-29 MED ORDER — SODIUM CHLORIDE 0.9 % IV SOLN: 250.0000 mL | INTRAVENOUS | Status: DC | PRN

## 2018-03-29 MED ORDER — ASPIRIN 81 MG PO CHEW: 81.0000 mg | CHEWABLE_TABLET | Freq: Every day | ORAL | Status: DC

## 2018-03-29 MED ORDER — VERAPAMIL HCL 2.5 MG/ML IV SOLN
INTRAVENOUS | Status: AC
  Filled 2018-03-29: qty 2

## 2018-03-29 MED ORDER — NITROGLYCERIN 1 MG/10 ML FOR IR/CATH LAB
INTRA_ARTERIAL | Status: DC | PRN
  Administered 2018-03-29 (×2): 200 ug via INTRACORONARY

## 2018-03-29 MED ORDER — NITROGLYCERIN 0.4 MG SL SUBL
SUBLINGUAL_TABLET | SUBLINGUAL | Status: AC
  Filled 2018-03-29: qty 1

## 2018-03-29 MED ORDER — ASPIRIN EC 81 MG PO TBEC
81.0000 mg | DELAYED_RELEASE_TABLET | Freq: Every day | ORAL | Status: DC
  Administered 2018-03-30 – 2018-04-01 (×3): 81 mg via ORAL
  Filled 2018-03-29 (×3): qty 1

## 2018-03-29 MED ORDER — LABETALOL HCL 5 MG/ML IV SOLN: 10.0000 mg | INTRAVENOUS | Status: AC | PRN

## 2018-03-29 MED ORDER — MORPHINE SULFATE (PF) 2 MG/ML IV SOLN
INTRAVENOUS | Status: AC
  Filled 2018-03-29: qty 1

## 2018-03-29 MED ORDER — SODIUM CHLORIDE 0.9 % IV BOLUS
500.0000 mL | Freq: Once | INTRAVENOUS | Status: AC
  Administered 2018-03-29: 500 mL via INTRAVENOUS

## 2018-03-29 MED ORDER — IOHEXOL 350 MG/ML SOLN
INTRAVENOUS | Status: DC | PRN
  Administered 2018-03-29: 115 mL via INTRA_ARTERIAL

## 2018-03-29 MED ORDER — LIDOCAINE HCL (PF) 1 % IJ SOLN
INTRAMUSCULAR | Status: AC
  Filled 2018-03-29: qty 30

## 2018-03-29 MED ORDER — TIROFIBAN HCL IV 12.5 MG/250 ML
INTRAVENOUS | Status: AC
  Filled 2018-03-29: qty 250

## 2018-03-29 MED ORDER — TICAGRELOR 90 MG PO TABS
ORAL_TABLET | ORAL | Status: DC | PRN
  Administered 2018-03-29: 180 mg via ORAL

## 2018-03-29 MED ORDER — METOPROLOL TARTRATE 5 MG/5ML IV SOLN
INTRAVENOUS | Status: AC
  Filled 2018-03-29: qty 5

## 2018-03-29 MED ORDER — ENOXAPARIN SODIUM 40 MG/0.4ML ~~LOC~~ SOLN
40.0000 mg | SUBCUTANEOUS | Status: DC
  Administered 2018-03-30 – 2018-04-01 (×3): 40 mg via SUBCUTANEOUS
  Filled 2018-03-29 (×3): qty 0.4

## 2018-03-29 MED ORDER — ACETAMINOPHEN 325 MG PO TABS
650.0000 mg | ORAL_TABLET | ORAL | Status: DC | PRN
  Administered 2018-03-29 – 2018-03-31 (×4): 650 mg via ORAL
  Filled 2018-03-29 (×4): qty 2

## 2018-03-29 MED ORDER — TIROFIBAN HCL IN NACL 5-0.9 MG/100ML-% IV SOLN
0.1500 ug/kg/min | INTRAVENOUS | Status: DC
  Filled 2018-03-29 (×2): qty 100

## 2018-03-29 MED ORDER — HEPARIN (PORCINE) IN NACL 1000-0.9 UT/500ML-% IV SOLN
INTRAVENOUS | Status: DC | PRN
  Administered 2018-03-29 (×2): 500 mL

## 2018-03-29 MED ORDER — TICAGRELOR 90 MG PO TABS
ORAL_TABLET | ORAL | Status: AC
  Filled 2018-03-29: qty 2

## 2018-03-29 MED ORDER — ASPIRIN 81 MG PO CHEW
CHEWABLE_TABLET | ORAL | Status: AC
  Administered 2018-03-29: 324 mg via ORAL
  Filled 2018-03-29: qty 4

## 2018-03-29 MED ORDER — TICAGRELOR 90 MG PO TABS
90.0000 mg | ORAL_TABLET | Freq: Two times a day (BID) | ORAL | Status: DC
  Administered 2018-03-29 – 2018-04-01 (×6): 90 mg via ORAL
  Filled 2018-03-29 (×6): qty 1

## 2018-03-29 MED ORDER — HYDRALAZINE HCL 20 MG/ML IJ SOLN: 5.0000 mg | INTRAMUSCULAR | Status: AC | PRN

## 2018-03-29 MED ORDER — CARVEDILOL 3.125 MG PO TABS
6.2500 mg | ORAL_TABLET | Freq: Two times a day (BID) | ORAL | Status: DC
  Administered 2018-03-29 – 2018-04-01 (×7): 6.25 mg via ORAL
  Filled 2018-03-29 (×7): qty 2

## 2018-03-29 MED ORDER — NITROGLYCERIN 0.4 MG SL SUBL
0.4000 mg | SUBLINGUAL_TABLET | SUBLINGUAL | Status: DC | PRN
  Filled 2018-03-29: qty 1

## 2018-03-29 SURGICAL SUPPLY — 17 items
BALLN SAPPHIRE ~~LOC~~ 3.75X12 (BALLOONS) ×2 IMPLANT
CATH 5FR JL3.5 JR4 ANG PIG MP (CATHETERS) ×2 IMPLANT
CATH EXTRAC PRONTO 5.5F 138CM (CATHETERS) ×2 IMPLANT
CATH LAUNCHER 6FR JR4 (CATHETERS) ×2 IMPLANT
DEVICE RAD COMP TR BAND LRG (VASCULAR PRODUCTS) ×2 IMPLANT
GLIDESHEATH SLEND SS 6F .021 (SHEATH) ×2 IMPLANT
GUIDEWIRE INQWIRE 1.5J.035X260 (WIRE) ×1 IMPLANT
INQWIRE 1.5J .035X260CM (WIRE) ×2
KIT ENCORE 26 ADVANTAGE (KITS) ×2 IMPLANT
KIT HEART LEFT (KITS) ×2 IMPLANT
KIT HEMO VALVE WATCHDOG (MISCELLANEOUS) ×2 IMPLANT
PACK CARDIAC CATHETERIZATION (CUSTOM PROCEDURE TRAY) ×2 IMPLANT
STENT SYNERGY DES 3.5X16 (Permanent Stent) ×2 IMPLANT
SYR MEDRAD MARK V 150ML (SYRINGE) ×2 IMPLANT
TRANSDUCER W/STOPCOCK (MISCELLANEOUS) ×2 IMPLANT
TUBING CIL FLEX 10 FLL-RA (TUBING) ×2 IMPLANT
WIRE ASAHI PROWATER 180CM (WIRE) ×2 IMPLANT

## 2018-03-29 NOTE — H&P (Signed)
Cardiology Admission History and Physical:   Patient ID: Robert Benson MRN: [id number]; DOB: 06/28/1961   Admission date: 03/29/2018  Primary Care Provider: Clayborn Heron, MD Primary Cardiologist: No primary care provider on file. new Primary Electrophysiologist:  None   Chief Complaint:  Chest pain  Patient Profile:   Robert Benson is a 57 y.o. male with long history of tobacco abuse presents with an inferior STEMI  History of Present Illness:   Robert Benson had acute onset mid sternal chest pain beginning at 7:30 pm last night. Pain radiated to left shoulder and into throat. Associated with diaphoresis, N/V. Presented to ED this am and Ecg showed ST elevation in inferior leads. No prior history of chest pain. No known medical history but doesn't go to see doctors. 2.5 ppd smoker. No history of DM, HTN, HLD.    Past Medical History:  Diagnosis Date  . Arthritis     Past Surgical History:  Procedure Laterality Date  . HARDWARE REMOVAL Right 11/03/2014   Procedure: HARDWARE REMOVAL from middle finger;  Surgeon: Bradly Bienenstock, MD;  Location: [facility] OR;  Service: Orthopedics;  Laterality: Right;  . I&D EXTREMITY Right 08/23/2014   Procedure: EXPLORATION RIGHT HAND, THUMB INDEX, LONG AND RING FINGER, Index finger amputation, IP Fusion of Long Finger, ORIF of Thumb;  Surgeon: Bradly Bienenstock, MD;  Location: [facility] OR;  Service: Orthopedics;  Laterality: Right;  . NERVE, TENDON AND ARTERY REPAIR Right 08/23/2014   Procedure: NERVE, TENDON, BONE AND ARTERY REPAIR AS NECESSARY;  Surgeon: Bradly Bienenstock, MD;  Location: [facility] OR;  Service: Orthopedics;  Laterality: Right;  . OPEN REDUCTION INTERNAL FIXATION (ORIF) HAND Right 08/23/2014   Procedure: OPEN REDUCTION INTERNAL FIXATION (ORIF) HAND;  Surgeon: Bradly Bienenstock, MD;  Location: [facility] OR;  Service: Orthopedics;  Laterality: Right;     Medications Prior to Admission: Prior to Admission medications   Medication Sig Start Date End Date Taking? Authorizing  Provider  docusate sodium (COLACE) 100 MG capsule Take 1 capsule (100 mg total) by mouth 2 (two) times daily. Patient not taking: Reported on 10/28/2014 08/23/14   Bradly Bienenstock, MD  oxyCODONE-acetaminophen (PERCOCET) 10-325 MG per tablet Take 20 tablets by mouth every 4 (four) hours as needed for pain. 11/03/14   Bradly Bienenstock, MD     Allergies:    Allergies  Allergen Reactions  . Morphine And Related Nausea And Vomiting    Social History:   Social History   Socioeconomic History  . Marital status: Married    Spouse name: Not on file  . Number of children: Not on file  . Years of education: Not on file  . Highest education level: Not on file  Occupational History  . Occupation: roofer/construction  Social Needs  . Financial resource strain: Not on file  . Food insecurity:    Worry: Not on file    Inability: Not on file  . Transportation needs:    Medical: Not on file    Non-medical: Not on file  Tobacco Use  . Smoking status: Current Every Day Smoker    Packs/day: 2.50    Years: 38.00    Pack years: 95.00  . Smokeless tobacco: Never Used  Substance and Sexual Activity  . Alcohol use: No  . Drug use: Yes    Types: Marijuana    Comment: 11/02/14- last time" this am"  . Sexual activity: Not on file  Lifestyle  . Physical activity:    Days per week: Not on file  Minutes per session: Not on file  . Stress: Not on file  Relationships  . Social connections:    Talks on phone: Not on file    Gets together: Not on file    Attends religious service: Not on file    Active member of club or organization: Not on file    Attends meetings of clubs or organizations: Not on file    Relationship status: Not on file  . Intimate partner violence:    Fear of current or ex partner: Not on file    Emotionally abused: Not on file    Physically abused: Not on file    Forced sexual activity: Not on file  Other Topics Concern  . Not on file  Social History Narrative  . Not on file      Family History:   Family history is negative for vascular disease. Father died in MVA, mother died with stomach trouble. 7 siblings but no known CAD  ROS:  Please see the history of present illness.  All other ROS reviewed and negative.     Physical Exam/Data:   Vitals:   03/29/18 0629 03/29/18 0638 03/29/18 0655  BP:  (!) 155/102 (!) 148/100  Pulse: (P) 88 92 80  Resp: (P) 18 20 12   Temp:  98.8 F (37.1 C) 97.7 F (36.5 C)  TempSrc:  Oral Oral  SpO2:  98% 100%  Weight:  59 kg   Height:  5\' 5"  (1.651 m)    No intake or output data in the 24 hours ending 03/29/18 0717 Filed Weights   03/29/18 0638  Weight: 59 kg   Body mass index is 21.63 kg/m.  General:  Well nourished, well developed, in mild acute distress HEENT: normal Lymph: no adenopathy Neck: no JVD Endocrine:  No thryomegaly Vascular: No carotid bruits; FA pulses 2+ bilaterally without bruits  Cardiac:  normal S1, S2; RRR; no murmur  Lungs:  clear to auscultation bilaterally, no wheezing, rhonchi or rales  Abd: soft, nontender, no hepatomegaly  Ext: no edema Musculoskeletal:  No deformities, BUE and BLE strength normal and equal Skin: warm and dry  Neuro:  CNs 2-12 intact, no focal abnormalities noted Psych:  Normal affect    EKG:  The ECG that was done today was personally reviewed and demonstrates NSR with 2 mm ST elevation in the inferior leads c/w STEMI.   Relevant CV Studies: none  Laboratory Data:  ChemistryNo results for input(s): NA, K, CL, CO2, GLUCOSE, BUN, CREATININE, CALCIUM, GFRNONAA, GFRAA, ANIONGAP in the last 168 hours.  No results for input(s): PROT, ALBUMIN, AST, ALT, ALKPHOS, BILITOT in the last 168 hours. Hematology Recent Labs  Lab 03/29/18 0645  WBC 16.1*  RBC 5.08  HGB 15.9  HCT 45.8  MCV 90.2  MCH 31.3  MCHC 34.7  RDW 13.0  PLT 229   Cardiac EnzymesNo results for input(s): TROPONINI in the last 168 hours.  Recent Labs  Lab 03/29/18 0645  TROPIPOC 14.14*     BNPNo results for input(s): BNP, PROBNP in the last 168 hours.  DDimer No results for input(s): DDIMER in the last 168 hours.  Radiology/Studies:  Dg Chest Port 1 View  Result Date: 03/29/2018 CLINICAL DATA:  Pain.  Code STEMI. EXAM: PORTABLE CHEST 1 VIEW COMPARISON:  None. FINDINGS: Lung volumes are low.The cardiomediastinal contours are normal. The lungs are clear. Pulmonary vasculature is normal. No consolidation, pleural effusion, or pneumothorax. No acute osseous abnormalities are seen. Degenerative change in the spine. IMPRESSION:  Low lung volumes without acute finding. Electronically Signed   By: Narda Rutherford M.D.   On: 03/29/2018 06:56    Assessment and Plan:   1. Acute inferior STEMI. Ongoing chest pain. Late presentation. Will proceed with emergent cardiac cath +/- PCI. ASA and IV heparin given. 2. Tobacco abuse recommend smoking cessation  Severity of Illness: The appropriate patient status for this patient is INPATIENT. Inpatient status is judged to be reasonable and necessary in order to provide the required intensity of service to ensure the patient's safety. The patient's presenting symptoms, physical exam findings, and initial radiographic and laboratory data in the context of their chronic comorbidities is felt to place them at high risk for further clinical deterioration. Furthermore, it is not anticipated that the patient will be medically stable for discharge from the hospital within 2 midnights of admission. The following factors support the patient status of inpatient.   " The patient's presenting symptoms include chest pain. " The worrisome physical exam findings include none. " The initial radiographic and laboratory data are worrisome because of ST elevation on Ecg. " The chronic co-morbidities include tobacco abuse.   * I certify that at the point of admission it is my clinical judgment that the patient will require inpatient hospital care spanning beyond 2  midnights from the point of admission due to high intensity of service, high risk for further deterioration and high frequency of surveillance required.*    For questions or updates, please contact [facility] Please consult [email or web address] for contact info under        Signed, Peter Swaziland, MD  03/29/2018 7:17 AM

## 2018-03-29 NOTE — Progress Notes (Signed)
   03/29/18 0641  Clinical Encounter Type  Visited With Patient and family together  Visit Type ED;Code  Spiritual Encounters  Spiritual Needs Prayer;Emotional  Advance Directives (For Healthcare)  Does Patient Have a Medical Advance Directive? No  Mental Health Advance Directives  Does Patient Have a Mental Health Advance Directive? No  Chaplain responded to STEMI. [facility]. Chaplain offered prayer while wife was in route, and then accompanied her to unit when she arrived. Chaplain then brought additional family members to [facility] while patient was treated at cath lab. After doctor gave patient's wife report on procedure,  Chaplain offered words of encouragement and support to family. Rev. Lynnell ChadVirginia Arjan Strohm

## 2018-03-29 NOTE — ED Notes (Signed)
Pt is a transfer from [facility], CP began last night 8/10 pain, PTA 5000 units heparin and 324 ASA

## 2018-03-29 NOTE — Progress Notes (Signed)
Critical Value EKG [facility] to [facility], RN @9 :15 am.

## 2018-03-29 NOTE — ED Provider Notes (Addendum)
[facility]-EMERGENCY DEPT Provider Note   CSN: [id number] Arrival date & time: 03/29/18  0609     History   Chief Complaint Chief Complaint  Patient presents with  . Chest Pain    HPI Robert Benson is a 57 y.o. male.  The history is provided by the patient.  Chest Pain   This is a new problem. The current episode started 6 to 12 hours ago. The problem occurs constantly. The problem has not changed since onset.The pain is associated with rest. The pain is present in the substernal region. The pain is at a severity of 8/10. The pain is severe. The quality of the pain is described as dull. The pain radiates to the right shoulder and left shoulder. Exacerbated by: none. Associated symptoms include diaphoresis, nausea, shortness of breath and vomiting. He has tried nothing for the symptoms. The treatment provided no relief. Risk factors include male gender and smoking/tobacco exposure.  Pertinent negatives for past medical history include no MI.  Pertinent negatives for family medical history include: no Marfan's syndrome.  Procedure history is negative for cardiac catheterization.    Past Medical History:  Diagnosis Date  . Arthritis     Patient Active Problem List   Diagnosis Date Noted  . BACK PAIN, LUMBAR 10/26/2008    Past Surgical History:  Procedure Laterality Date  . HARDWARE REMOVAL Right 11/03/2014   Procedure: HARDWARE REMOVAL from middle finger;  Surgeon: Bradly BienenstockFred Ortmann, MD;  [facility];  Service: Orthopedics;  Laterality: Right;  . I&D EXTREMITY Right 08/23/2014   Procedure: EXPLORATION RIGHT HAND, THUMB INDEX, LONG AND RING FINGER, Index finger amputation, IP Fusion of Long Finger, ORIF of Thumb;  Surgeon: Bradly BienenstockFred Ortmann, MD;  Location: [facility] OR;  Service: Orthopedics;  Laterality: Right;  . NERVE, TENDON AND ARTERY REPAIR Right 08/23/2014   Procedure: NERVE, TENDON, BONE AND ARTERY REPAIR AS NECESSARY;  Surgeon: Bradly BienenstockFred Ortmann, MD;  Location: [facility] OR;   Service: Orthopedics;  Laterality: Right;  . OPEN REDUCTION INTERNAL FIXATION (ORIF) HAND Right 08/23/2014   Procedure: OPEN REDUCTION INTERNAL FIXATION (ORIF) HAND;  Surgeon: Bradly BienenstockFred Ortmann, MD;  Location: [facility] OR;  Service: Orthopedics;  Laterality: Right;        Home Medications    Prior to Admission medications   Medication Sig Start Date End Date Taking? Authorizing Provider  docusate sodium (COLACE) 100 MG capsule Take 1 capsule (100 mg total) by mouth 2 (two) times daily. Patient not taking: Reported on 10/28/2014 08/23/14   Bradly Bienenstockrtmann, Fred, MD  oxyCODONE-acetaminophen (PERCOCET) 10-325 MG per tablet Take 20 tablets by mouth every 4 (four) hours as needed for pain. 11/03/14   Bradly Bienenstockrtmann, Fred, MD    Family History No family history on file.  Social History Social History   Tobacco Use  . Smoking status: Current Every Day Smoker    Packs/day: 1.50    Years: 38.00    Pack years: 57.00  . Smokeless tobacco: Never Used  Substance Use Topics  . Alcohol use: No  . Drug use: Yes    Types: Marijuana    Comment: 11/02/14- last time" this am"     Allergies   Morphine and related   Review of Systems Review of Systems  Constitutional: Positive for diaphoresis.  Respiratory: Positive for shortness of breath.   Cardiovascular: Positive for chest pain.  Gastrointestinal: Positive for nausea and vomiting.  All other systems reviewed and are negative.    Physical Exam Updated Vital Signs Pulse (P)  88   Resp (P) 18   Physical Exam  Constitutional: He is oriented to person, place, and time. He appears well-developed and well-nourished.  HENT:  Head: Normocephalic and atraumatic.  Mouth/Throat: No oropharyngeal exudate.  Eyes: Pupils are equal, round, and reactive to light. Conjunctivae are normal.  Neck: Normal range of motion. Neck supple. No JVD present.  Cardiovascular: Normal rate, regular rhythm, normal heart sounds and intact distal pulses.  Pulmonary/Chest: Effort normal  and breath sounds normal. No respiratory distress. He has no wheezes.  Abdominal: Soft. Bowel sounds are normal. He exhibits no mass. There is no tenderness. There is no rebound and no guarding.  Musculoskeletal: Normal range of motion.  Neurological: He is alert and oriented to person, place, and time. He displays normal reflexes.  Skin: Skin is warm and dry. Capillary refill takes less than 2 seconds. He is not diaphoretic.  Psychiatric: He has a normal mood and affect.     ED Treatments / Results  Labs (all labs ordered are listed, but only abnormal results are displayed) Results for orders placed or performed during the hospital encounter of 03/29/18  CBC with Differential/Platelet  Result Value Ref Range   WBC 16.1 (H) 4.0 - 10.5 K/uL   RBC 5.08 4.22 - 5.81 MIL/uL   Hemoglobin 15.9 13.0 - 17.0 g/dL   HCT 16.1 09.6 - 04.5 %   MCV 90.2 78.0 - 100.0 fL   MCH 31.3 26.0 - 34.0 pg   MCHC 34.7 30.0 - 36.0 g/dL   RDW 40.9 81.1 - 91.4 %   Platelets 229 150 - 400 K/uL   Neutrophils Relative % 87 %   Neutro Abs 13.9 (H) 1.7 - 7.7 K/uL   Lymphocytes Relative 8 %   Lymphs Abs 1.3 0.7 - 4.0 K/uL   Monocytes Relative 5 %   Monocytes Absolute 0.9 0.1 - 1.0 K/uL   Eosinophils Relative 0 %   Eosinophils Absolute 0.0 0.0 - 0.7 K/uL   Basophils Relative 0 %   Basophils Absolute 0.0 0.0 - 0.1 K/uL  POCT i-Stat troponin I  Result Value Ref Range   Troponin i, poc 14.14 (HH) 0.00 - 0.08 ng/mL   Comment NOTIFIED PHYSICIAN    Comment 3           No results found.  EKG EKG Interpretation  Date/Time:  Saturday March 29 2018 06:27:07 EDT Ventricular Rate:  69 PR Interval:    QRS Duration: 86 QT Interval:  396 QTC Calculation: 425 R Axis:   63 Text Interpretation:  Sinus rhythm Ventricular premature complex Borderline short PR interval Consider right atrial enlargement Inferior infarct, acute (RCA) Probable RV involvement, suggest recording right precordial leads >>> Acute MI <<<  Confirmed by Nicanor Alcon, Stepheny Canal ([id number]) on 03/29/2018 6:33:51 AM   Radiology No results found.  Procedures Procedures (including critical care time)  Medications Ordered in ED Medications  0.9 %  sodium chloride infusion (has no administration in time range)  aspirin chewable tablet 324 mg (has no administration in time range)  heparin injection 60 Units/kg (has no administration in time range)  sodium chloride 0.9 % bolus 500 mL (has no administration in time range)  aspirin 81 MG chewable tablet (has no administration in time range)   NO NITROglycerin as is an RV infarct.      633 Case d/w Dr. Swaziland, no Flonnie Overman.  Heparin.  Transfer to the cath lab   Final Clinical Impressions(s) / ED Diagnoses    Elevated troponin!!!!!  Case d/w [facility] en route.       Angellina Ferdinand, MD 03/29/18 1610    Cy Blamer, MD 03/29/18 9604

## 2018-03-29 NOTE — Progress Notes (Signed)
PHARMACY CONSULT NOTE - Initial Consult  Pharmacy Consult for Tirofiban Indication: s/p STEMI  Allergies  Allergen Reactions  . Morphine And Related Nausea And Vomiting    Patient Measurements: Height: 5\' 5"  (165.1 cm) Weight: 130 lb (59 kg) IBW/kg (Calculated) : 61.5  Vital Signs: Temp: 97.7 F (36.5 C) (09/14 0655) Temp Source: Oral (09/14 0655) BP: 129/94 (09/14 0900) Pulse Rate: 103 (09/14 0900)  Labs: Recent Labs    03/29/18 0645  HGB 15.9  HCT 45.8  PLT 229  APTT 32  LABPROT 13.2  INR 1.01  CREATININE 0.74  TROPONINI 14.77*    Estimated Creatinine Clearance: 87.1 mL/min (by C-G formula based on SCr of 0.74 mg/dL).   Medical History: Past Medical History:  Diagnosis Date  . Arthritis     Medications:  Infusions:  . sodium chloride    . sodium chloride    . sodium chloride 1 mL/kg/hr (03/29/18 0908)  . tirofiban      Assessment: 57 yo male admitted with STEMI, s/p PCI to RCA.  Pharmacy asked to continue tirofiban x 18 hrs.  Initiated ~ 745 AM today.  Goal of Therapy:  Monitor platelets by anticoagulation protocol: Yes   Plan:  1. Continue tirofiban 0.15 mcg/kg/min x 18 hrs. 2. Check CBC in 8 hrs.  Jenetta DownerJessica Sui Kasparek, Pharm D, BCPS, [facility] Clinical Pharmacist Phone [phone]  03/29/2018 9:18 AM

## 2018-03-29 NOTE — Progress Notes (Signed)
Patient complaints of 6/10 CP and pressure with nausea. 1 Nitro given sublingual. Placed on 2LNC.  Zofran IV given for nausea. CP now 3/10. Pt refusing another nitro at this time. PA and MD paged. Will continue to monitor.  Delories HeinzMelissa Leaman Abe, RN

## 2018-03-29 NOTE — Progress Notes (Signed)
CRITICAL VALUE ALERT  Critical Value:  Troponin >65  Date & Time Notied:  03/29/2018 10:30  Provider Notified: Bghat, PA  Orders Received/Actions taken: No new orders at this time.

## 2018-03-29 NOTE — ED Notes (Addendum)
[facility] have been called 

## 2018-03-29 NOTE — ED Triage Notes (Signed)
Pt presents to ED from home for chest pain. Pt reports that the pain started around 7pm and he thought it was indigestion. Pt reports that the pain has been constant since then. Pt reports nausea and sweating. Pt hasn't been to a doctor is several years.

## 2018-03-29 NOTE — ED Provider Notes (Addendum)
6:58 AM  BP (!) 148/100   Pulse 80   Temp 97.7 F (36.5 C) (Oral)   Resp 12   Ht 5\' 5"  (1.651 m)   Wt 59 kg   SpO2 100%   BMI 21.63 kg/m  [facility].  AWAITING CATH LAB TEAM. [facility] Dr. Nicanor AlconPalumbo. Onset of retrosternal CP radiating into both Shoulders.  EKG shows Acute Inferior ST elevation MI. 2 PPD smoker. Patient states that he has not sen a doctor in 40 years. Patient received ASA  3550 mg of Heparin and has an infusion currently running. Pain is 8/10     EKG Interpretation  Date/Time:  Saturday March 29 2018 06:27:07 EDT Ventricular Rate:  69 PR Interval:    QRS Duration: 86 QT Interval:  396 QTC Calculation: 425 R Axis:   63 Text Interpretation:  Sinus rhythm Ventricular premature complex Borderline short PR interval Consider right atrial enlargement Inferior infarct, acute (RCA) Probable RV involvement, suggest recording right precordial leads >>> Acute MI <<< Confirmed by Nicanor AlconPalumbo, April ([id number]) on 03/29/2018 6:33:51 AM       Arthor CaptainHarris, Astella Desir, PA-C 03/29/18 0718    Arthor CaptainHarris, Sequoya Hogsett, PA-C 03/29/18 0820    Rolan BuccoBelfi, Melanie, MD 03/29/18 [phone]

## 2018-03-30 ENCOUNTER — Other Ambulatory Visit (HOSPITAL_COMMUNITY): Payer: Self-pay

## 2018-03-30 LAB — LIPID PANEL
CHOL/HDL RATIO: 3.4 ratio
Cholesterol: 111 mg/dL (ref 0–200)
HDL: 33 mg/dL — ABNORMAL LOW (ref >40)
LDL Cholesterol: 68 mg/dL (ref 0–99)
Triglycerides: 52 mg/dL (ref <150)
VLDL: 10 mg/dL (ref 0–40)

## 2018-03-30 LAB — BASIC METABOLIC PANEL
Anion gap: 9 (ref 5–15)
BUN: 10 mg/dL (ref 6–20)
CHLORIDE: 108 mmol/L (ref 98–111)
CO2: 23 mmol/L (ref 22–32)
Calcium: 8.4 mg/dL — ABNORMAL LOW (ref 8.9–10.3)
Creatinine, Ser: 0.81 mg/dL (ref 0.61–1.24)
GFR calc Af Amer: >60 mL/min (ref >60)
GFR calc non Af Amer: >60 mL/min (ref >60)
GLUCOSE: 107 mg/dL — AB (ref 70–99)
Potassium: 3.9 mmol/L (ref 3.5–5.1)
Sodium: 140 mmol/L (ref 135–145)

## 2018-03-30 LAB — CBC
HCT: 43.7 % (ref 39.0–52.0)
Hemoglobin: 14.3 g/dL (ref 13.0–17.0)
MCH: 30.6 pg (ref 26.0–34.0)
MCHC: 32.7 g/dL (ref 30.0–36.0)
MCV: 93.6 fL (ref 78.0–100.0)
Platelets: 190 10*3/uL (ref 150–400)
RBC: 4.67 MIL/uL (ref 4.22–5.81)
RDW: 13.2 % (ref 11.5–15.5)
WBC: 10.8 10*3/uL — ABNORMAL HIGH (ref 4.0–10.5)

## 2018-03-30 LAB — HIV ANTIBODY (ROUTINE TESTING W REFLEX): HIV SCREEN 4TH GENERATION: NONREACTIVE

## 2018-03-30 NOTE — Progress Notes (Signed)
Progress Note  Patient Name: Robert MaxwellJohn M Benson Date of Encounter: 03/30/2018  Primary Cardiologist: Peter SwazilandJordan, MD   Subjective   Overall he is feeling better.  Minimal chest discomfort.  No coughing.  We discussed his 3 pack/day tobacco use.  Inpatient Medications    Scheduled Meds: . aspirin EC  81 mg Oral Daily  . atorvastatin  80 mg Oral q1800  . carvedilol  6.25 mg Oral BID WC  . enoxaparin (LOVENOX) injection  40 mg Subcutaneous Q24H  . sodium chloride flush  3 mL Intravenous Q12H  . ticagrelor  90 mg Oral BID   Continuous Infusions: . sodium chloride Stopped (03/30/18 0800)  . sodium chloride     PRN Meds: sodium chloride, acetaminophen, nitroGLYCERIN, ondansetron (ZOFRAN) IV, sodium chloride flush   Vital Signs    Vitals:   03/30/18 0500 03/30/18 0600 03/30/18 0700 03/30/18 0800  BP: (!) 137/96 (!) 124/92 113/72 (!) 127/93  Pulse: 68 81 86 97  Resp: 19 (!) 22 19 17   Temp:    98.9 F (37.2 C)  TempSrc:    Oral  SpO2: 99% 99% 98% 96%  Weight: 61.7 kg     Height:        Intake/Output Summary (Last 24 hours) at 03/30/2018 0956 Last data filed at 03/30/2018 0800 Gross per 24 hour  Intake 1927.01 ml  Output 1650 ml  Net 277.01 ml   Filed Weights   03/29/18 0638 03/30/18 0500  Weight: 59 kg 61.7 kg    Telemetry    Occasional runs of nonsustained ventricular tachycardia, currently sinus rhythm- Personally Reviewed  ECG    03/30/2018 at 7:06 AM sinus rhythm with 1 mm ST segment elevation in inferior leads, improved from original ECG, T wave inversion noted inferiorly.  Prior EKG from 03/29/2018 at 9:11 AM showed axis shift, possible idioventricular rhythm- Personally Reviewed  Physical Exam   GEN: No acute distress.   Neck: No JVD Cardiac: RRR, no murmurs, rubs, or gallops.  Respiratory: Clear to auscultation bilaterally. GI: Soft, nontender, non-distended  MS: No edema; No deformity. Neuro:  Nonfocal  Psych: Normal affect   Labs     Chemistry Recent Labs  Lab 03/29/18 0645 03/29/18 0906 03/30/18 0233  NA 136 137 140  K 3.8 3.5 3.9  CL 102 106 108  CO2 22 19* 23  GLUCOSE 139* 124* 107*  BUN 10 6 10   CREATININE 0.74 0.75 0.81  CALCIUM 9.2 8.5* 8.4*  PROT 7.2 6.6  --   ALBUMIN 4.0 3.7  --   AST 135* 352*  --   ALT 35 63*  --   ALKPHOS 70 71  --   BILITOT 0.9 1.0  --   GFRNONAA >60 >60 >60  GFRAA >60 >60 >60  ANIONGAP 12 12 9      Hematology Recent Labs  Lab 03/29/18 0906 03/29/18 1435 03/29/18 1600 03/30/18 0233  WBC 19.1*  --  14.4* 10.8*  RBC 5.08  --  4.78 4.67  HGB 15.3  --  14.5 14.3  HCT 46.4  --  44.2 43.7  MCV 91.3  --  92.5 93.6  MCH 30.1  --  30.3 30.6  MCHC 33.0  --  32.8 32.7  RDW 12.8  --  12.9 13.2  PLT 238 208 218 190    Cardiac Enzymes Recent Labs  Lab 03/29/18 0645 03/29/18 0906 03/29/18 1435 03/29/18 2004  TROPONINI 14.77* >65.00* >65.00* >65.00*    Recent Labs  Lab 03/29/18 0645  TROPIPOC 14.14*     BNPNo results for input(s): BNP, PROBNP in the last 168 hours.   DDimer No results for input(s): DDIMER in the last 168 hours.   Radiology    Dg Chest Port 1 View  Result Date: 03/29/2018 CLINICAL DATA:  Pain.  Code STEMI. EXAM: PORTABLE CHEST 1 VIEW COMPARISON:  None. FINDINGS: Lung volumes are low.The cardiomediastinal contours are normal. The lungs are clear. Pulmonary vasculature is normal. No consolidation, pleural effusion, or pneumothorax. No acute osseous abnormalities are seen. Degenerative change in the spine. IMPRESSION: Low lung volumes without acute finding. Electronically Signed   By: Narda Rutherford M.D.   On: 03/29/2018 06:56    Cardiac Studies   Cardiac catheterization 03/29/2018: 1. Single vessel occlusive CAD involving the proximal RCA with large thrombus burden. 2. Mild LV dysfunction with inferior wall motion abnormality 3. Normal LVEDP 4. Successful PCI of the RCA with aspiration thrombectomy and stenting with DES x 1.  Plan: DAPT for  one year. IV Aggrastat for 18 hours. High dose statin. May be a candidate for fast track DC if no complications.   Recommend uninterrupted dual antiplatelet therapy with Aspirin 81mg  daily and Ticagrelor 90mg  twice daily for a minimum of 12 months (ACS - Class I recommendation).  Patient Profile     57 y.o. male with inferior ST elevation myocardial infarction RCA stent mildly reduced ejection fraction 45% on heart catheterization with nonsustained ventricular tachycardia  Assessment & Plan    Inferior ST elevation myocardial infarction - RCA stent placed, tirofiban completed secondary to large thrombus burden, high peak troponin greater than 65.  He is at high risk for reinfarction over the next 30 days. -Continue with aggressive secondary risk factor prevention, cardiac rehab, aspirin, Brilinta for at least one year, beta-blocker, high intensity statin - I will check echocardiogram to further assess EF and valvular function.  Nonsustained ventricular tachycardia -Some of this is a accelerated idioventricular rhythm.  Otherwise VT is noted.  Coreg 6.25 mg twice a day has been started.  Currently sinus rhythm.  Tobacco use -Smokes 3 packs/day.  Tobacco cessation discussed at length.  Given his recent NSVT, I will continue to watch him here but if bed availability as needed, he may transfer to telemetry floor.  Anticipate discharge tomorrow.  For questions or updates, please contact [facility] Please consult [email or web address] for contact info under        Signed, Donato Schultz, MD  03/30/2018, 9:56 AM

## 2018-03-30 NOTE — Progress Notes (Signed)
Critical value EKG given to [place], [place]

## 2018-03-31 ENCOUNTER — Encounter (HOSPITAL_COMMUNITY): Payer: Self-pay | Admitting: *Deleted

## 2018-03-31 ENCOUNTER — Inpatient Hospital Stay (HOSPITAL_COMMUNITY): Payer: Medicaid Other

## 2018-03-31 ENCOUNTER — Other Ambulatory Visit: Payer: Self-pay

## 2018-03-31 DIAGNOSIS — I472 Ventricular tachycardia: Secondary | ICD-10-CM

## 2018-03-31 DIAGNOSIS — I340 Nonrheumatic mitral (valve) insufficiency: Secondary | ICD-10-CM

## 2018-03-31 LAB — ECHOCARDIOGRAM COMPLETE
Height: 65 in
Weight: 2176.38 oz

## 2018-03-31 LAB — POCT ACTIVATED CLOTTING TIME
Activated Clotting Time: 307 seconds
Activated Clotting Time: 720 seconds

## 2018-03-31 NOTE — Care Management Note (Addendum)
Case Management Note  Patient Details  Name: Robert Benson MRN: [id number] Date of Birth: 09-24-1960  Subjective/Objective: 57 y.o. male with long history of tobacco abuse presents with an inferior STEMI. Patient lives at home with spouse, independent with ADLs with no DME in use. Patient verbalized being uninsured with no local PCP.                   Action/Plan: CM met with patient/spouse to discuss transitional needs. Brilinta 30 day card and the Brilinta astrazeneca application provided and explained, with patient/spouse verbalizing understanding. CM placed the provider section of the Brilinta application on patient's shadow chart for completion, with request to return to patient prior to discharge. Patient agreeable to a hospital f/u appointment being arranged and patient indicated he's able to afford $4-10 medications from [facility]. Hospital f/u appointment arranged at: [facility] on September, 25 @ 1pm, with patient to f/[facility] at [facility] for his Rx needs; AVS updated. Spouse verbalized being able to provide transportation home and any assistance with patient's ADLs as needed. CM will continue to follow.   Expected Discharge Date:                  Expected Discharge Plan:  Home/Self Care  In-House Referral:  NA  Discharge planning Services  CM Consult, Follow-up appt scheduled, Medication Assistance(Brilinta 30day card; astrazeneca Brilinta application provided)  Post Acute Care Choice:  NA Choice offered to:  NA  DME Arranged:  N/A DME Agency:  NA  HH Arranged:  NA HH Agency:  NA  Status of Service:  In process, will continue to follow  If discussed at Long Length of Stay Meetings, dates discussed:    Additional Comments:  Midge Minium RN, BSN, NCM-BC, ACM-RN [phone] 03/31/2018, 10:35 AM

## 2018-03-31 NOTE — Progress Notes (Signed)
CARDIAC REHAB PHASE I   PRE:  Rate/Rhythm: 71 SR    BP: sitting 101/79    SaO2:   MODE:  Ambulation: 370 ft   POST:  Rate/Rhythm: 81 SR    BP: sitting 101/77     SaO2:   Tolerated well, no c/o. Thankful to be out of bed. Ed completed with pt and family. He is wanting to quit smoking. Gave him resources and tips. He understands the importance of Brilinta/ASA. Will refer to [place] [facility]. We also discussed the need to cut down on sweets/junk food.  [id number]   Robert MassonRandi Kristan Garcia Benson CES, ACSM 03/31/2018 11:34 AM

## 2018-03-31 NOTE — Progress Notes (Signed)
EKG CRITICAL VALUE     12 lead EKG performed.  Critical value noted. Jonny RuizJohn, RN notified.   Jaion Lagrange L, CCT 03/31/2018 7:15 AM

## 2018-03-31 NOTE — Progress Notes (Signed)
  Echocardiogram 2D Echocardiogram has been performed.  Robert Benson 03/31/2018, 10:11 AM

## 2018-03-31 NOTE — Progress Notes (Signed)
Progress Note  Patient Name: Robert Benson Date of Encounter: 03/31/2018  Primary Cardiologist: Peter Swaziland, MD   Subjective   No chest pain this am. No dyspnea. He feels great.   Inpatient Medications    Scheduled Meds: . aspirin EC  81 mg Oral Daily  . atorvastatin  80 mg Oral q1800  . carvedilol  6.25 mg Oral BID WC  . enoxaparin (LOVENOX) injection  40 mg Subcutaneous Q24H  . sodium chloride flush  3 mL Intravenous Q12H  . ticagrelor  90 mg Oral BID   Continuous Infusions: . sodium chloride Stopped (03/30/18 0800)  . sodium chloride     PRN Meds: sodium chloride, acetaminophen, nitroGLYCERIN, ondansetron (ZOFRAN) IV, sodium chloride flush   Vital Signs    Vitals:   03/31/18 0400 03/31/18 0600 03/31/18 0700 03/31/18 0741  BP:  102/78 109/73   Pulse:      Resp: (!) 22 17 20    Temp:   98.6 F (37 C) 98.9 F (37.2 C)  TempSrc:   Oral Oral  SpO2:  95% 95%   Weight:      Height:        Intake/Output Summary (Last 24 hours) at 03/31/2018 0749 Last data filed at 03/31/2018 0700 Gross per 24 hour  Intake 1080 ml  Output 2875 ml  Net -1795 ml   Filed Weights   03/29/18 0638 03/30/18 0500  Weight: 59 kg 61.7 kg    Telemetry     sinus, no PVCs. Personally Reviewed  ECG    Sinus, inferior ST elevation with T wave inversion (evolution of inferior MI). Personally Reviewed  Physical Exam   General: Well developed, well nourished, NAD  HEENT: OP clear, mucus membranes moist  SKIN: warm, dry. No rashes. Neuro: No focal deficits  Musculoskeletal: Muscle strength 5/5 all ext  Psychiatric: Mood and affect normal  Neck: No JVD, no carotid bruits, no thyromegaly, no lymphadenopathy.  Lungs:Clear bilaterally, no wheezes, rhonci, crackles Cardiovascular: Regular rate and rhythm. No murmurs, gallops or rubs. Abdomen:Soft. Bowel sounds present. Non-tender.  Extremities: No lower extremity edema. Pulses are 2 + in the bilateral DP/PT.   Labs     Chemistry Recent Labs  Lab 03/29/18 0645 03/29/18 0906 03/30/18 0233  NA 136 137 140  K 3.8 3.5 3.9  CL 102 106 108  CO2 22 19* 23  GLUCOSE 139* 124* 107*  BUN 10 6 10   CREATININE 0.74 0.75 0.81  CALCIUM 9.2 8.5* 8.4*  PROT 7.2 6.6  --   ALBUMIN 4.0 3.7  --   AST 135* 352*  --   ALT 35 63*  --   ALKPHOS 70 71  --   BILITOT 0.9 1.0  --   GFRNONAA >60 >60 >60  GFRAA >60 >60 >60  ANIONGAP 12 12 9      Hematology Recent Labs  Lab 03/29/18 0906 03/29/18 1435 03/29/18 1600 03/30/18 0233  WBC 19.1*  --  14.4* 10.8*  RBC 5.08  --  4.78 4.67  HGB 15.3  --  14.5 14.3  HCT 46.4  --  44.2 43.7  MCV 91.3  --  92.5 93.6  MCH 30.1  --  30.3 30.6  MCHC 33.0  --  32.8 32.7  RDW 12.8  --  12.9 13.2  PLT 238 208 218 190    Cardiac Enzymes Recent Labs  Lab 03/29/18 0645 03/29/18 0906 03/29/18 1435 03/29/18 2004  TROPONINI 14.77* >65.00* >65.00* >65.00*    Recent Labs  Lab  03/29/18 0645  TROPIPOC 14.14*     BNPNo results for input(s): BNP, PROBNP in the last 168 hours.   DDimer No results for input(s): DDIMER in the last 168 hours.   Radiology    No results found.  Cardiac Studies   Cardiac catheterization 03/29/2018: 1. Single vessel occlusive CAD involving the proximal RCA with large thrombus burden. 2. Mild LV dysfunction with inferior wall motion abnormality 3. Normal LVEDP 4. Successful PCI of the RCA with aspiration thrombectomy and stenting with DES x 1.  Plan: DAPT for one year. IV Aggrastat for 18 hours. High dose statin. May be a candidate for fast track DC if no complications.   Recommend uninterrupted dual antiplatelet therapy with Aspirin 81mg  daily and Ticagrelor 90mg  twice daily for a minimum of 12 months (ACS - Class I recommendation).  Patient Profile     57 y.o. male with inferior ST elevation myocardial infarction RCA stent mildly reduced ejection fraction 45% on heart catheterization with nonsustained ventricular  tachycardia  Assessment & Plan    1. CAD/Inferior ST elevation myocardial infarction: Admitted on 03/29/18 with an acute inferior STEMI. The RCA had a heavy thrombus burden and was treated with aspiration thrombectomy and stenting. LV gram with inferior wall motion abnormality, LVEF=45-50%. He is on ASA, Brilinta, statin and beta blocker. Echo pending today.   2. Nonsustained ventricular tachycardia: No PVCs overnight. Continue beta blocker.   3. Tobacco use: Smoking cessation is recommended.   Will transfer to telemetry unit today. Echo later today. Likely discharge home tomorrow.   For questions or updates, please contact [facility] Please consult [email or web address] for contact info under        Signed, Verne Carrowhristopher McAlhany, MD  03/31/2018, 7:49 AM

## 2018-04-01 ENCOUNTER — Encounter (HOSPITAL_COMMUNITY): Payer: Self-pay | Admitting: Cardiology

## 2018-04-01 DIAGNOSIS — E785 Hyperlipidemia, unspecified: Secondary | ICD-10-CM

## 2018-04-01 DIAGNOSIS — I255 Ischemic cardiomyopathy: Secondary | ICD-10-CM

## 2018-04-01 DIAGNOSIS — I10 Essential (primary) hypertension: Secondary | ICD-10-CM

## 2018-04-01 LAB — BASIC METABOLIC PANEL
Anion gap: 14 (ref 5–15)
BUN: 9 mg/dL (ref 6–20)
CO2: 19 mmol/L — ABNORMAL LOW (ref 22–32)
CREATININE: 0.89 mg/dL (ref 0.61–1.24)
Calcium: 8.8 mg/dL — ABNORMAL LOW (ref 8.9–10.3)
Chloride: 105 mmol/L (ref 98–111)
Glucose, Bld: 100 mg/dL — ABNORMAL HIGH (ref 70–99)
Potassium: 3.9 mmol/L (ref 3.5–5.1)
SODIUM: 138 mmol/L (ref 135–145)

## 2018-04-01 LAB — CBC
HCT: 43.5 % (ref 39.0–52.0)
Hemoglobin: 14.2 g/dL (ref 13.0–17.0)
MCH: 30.4 pg (ref 26.0–34.0)
MCHC: 32.6 g/dL (ref 30.0–36.0)
MCV: 93.1 fL (ref 78.0–100.0)
PLATELETS: 200 10*3/uL (ref 150–400)
RBC: 4.67 MIL/uL (ref 4.22–5.81)
RDW: 12.5 % (ref 11.5–15.5)
WBC: 10.9 10*3/uL — ABNORMAL HIGH (ref 4.0–10.5)

## 2018-04-01 LAB — HEPATIC FUNCTION PANEL
ALBUMIN: 3.1 g/dL — AB (ref 3.5–5.0)
ALT: 44 U/L (ref 0–44)
AST: 61 U/L — ABNORMAL HIGH (ref 15–41)
Alkaline Phosphatase: 57 U/L (ref 38–126)
BILIRUBIN INDIRECT: 0.9 mg/dL (ref 0.3–0.9)
Bilirubin, Direct: 0.2 mg/dL (ref 0.0–0.2)
TOTAL PROTEIN: 6.2 g/dL — AB (ref 6.5–8.1)
Total Bilirubin: 1.1 mg/dL (ref 0.3–1.2)

## 2018-04-01 MED ORDER — ATORVASTATIN CALCIUM 80 MG PO TABS: 80.0000 mg | ORAL_TABLET | Freq: Every day | ORAL | 1 refills | Status: DC

## 2018-04-01 MED ORDER — TICAGRELOR 90 MG PO TABS: 90.0000 mg | ORAL_TABLET | Freq: Two times a day (BID) | ORAL | 2 refills | Status: DC

## 2018-04-01 MED ORDER — CARVEDILOL 6.25 MG PO TABS: 6.2500 mg | ORAL_TABLET | Freq: Two times a day (BID) | ORAL | 2 refills | Status: DC

## 2018-04-01 MED ORDER — ASPIRIN 81 MG PO TBEC: 81.0000 mg | DELAYED_RELEASE_TABLET | Freq: Every day | ORAL | Status: DC

## 2018-04-01 MED ORDER — NITROGLYCERIN 0.4 MG SL SUBL: 0.4000 mg | SUBLINGUAL_TABLET | SUBLINGUAL | 2 refills | Status: DC | PRN

## 2018-04-01 NOTE — Progress Notes (Signed)
Discharged home after instructions given to patient/family.  All questions answered.

## 2018-04-01 NOTE — Progress Notes (Signed)
CARDIAC REHAB PHASE I   PRE:  Rate/Rhythm: 78 SR  BP:  Sitting: 108/74      SaO2: 98 RA  MODE:  Ambulation: 370 ft 94 peak HR  POST:  Rate/Rhythm: 78 SR  BP:  Sitting: 115/88    SaO2: 99 RA   Pt ambulated [id number] in hallway independently with ease. Pt denies CP or SOB. Reinforced importance of Brilinta and ASA. Pt states understanding of need for smoking cessation. Pt referred to CRP II [place]. Pt hopeful for d/c today.  [id number] Reynold Boweneresa  Roddrick Sharron, RN BSN 04/01/2018 10:07 AM

## 2018-04-01 NOTE — Plan of Care (Signed)
  Problem: Education: Goal: Understanding of CV disease, CV risk reduction, and recovery process will improve Outcome: Progressing  Pt and family educated on BP levels and management in/out of hospital and questions answered Problem: Activity: Goal: Ability to return to baseline activity level will improve Outcome: Progressing   Problem: Cardiovascular: Goal: Ability to achieve and maintain adequate cardiovascular perfusion will improve Outcome: Progressing   Problem: Clinical Measurements: Goal: Will remain free from infection Outcome: Progressing   Problem: Nutrition: Goal: Adequate nutrition will be maintained Outcome: Progressing   Problem: Pain Managment: Goal: General experience of comfort will improve Outcome: Progressing   Problem: Safety: Goal: Ability to remain free from injury will improve Outcome: Progressing

## 2018-04-01 NOTE — Discharge Summary (Signed)
Discharge Summary    Patient ID: Robert Benson,  MRN: [id number], DOB/AGE: 56-May-1962 56 y.o.  Admit date: 03/29/2018 Discharge date: 04/01/2018  Primary Care Provider: Beverley Fiedler R Primary Cardiologist: Dr. Swaziland  Discharge Diagnoses    Principal Problem:   STEMI involving right coronary artery [facility]) Active Problems:   Tobacco abuse   Hyperlipidemia   Hypertension   Allergies Allergies  Allergen Reactions  . Morphine And Related Nausea And Vomiting  . Percocet [Oxycodone-Acetaminophen] Itching and Nausea And Vomiting    Diagnostic Studies/Procedures    Cath: 03/29/18   Prox RCA lesion is 99% stenosed.  Post intervention, there is a 0% residual stenosis.  A drug-eluting stent was successfully placed using a STENT SYNERGY DES 3.5X16.  There is mild left ventricular systolic dysfunction.  LV end diastolic pressure is normal.  The left ventricular ejection fraction is 45-50% by visual estimate.   1. Single vessel occlusive CAD involving the proximal RCA with large thrombus burden. 2. Mild LV dysfunction with inferior wall motion abnormality 3. Normal LVEDP 4. Successful PCI of the RCA with aspiration thrombectomy and stenting with DES x 1.  Plan: DAPT for one year. IV Aggrastat for 18 hours. High dose statin. May be a candidate for fast track DC if no complications.   Recommend uninterrupted dual antiplatelet therapy with Aspirin 81mg  daily and Ticagrelor 90mg  twice daily for a minimum of 12 months (ACS - Class I recommendation).  TTE: 03/31/18  Study Conclusions  - Left ventricle: The cavity size was normal. Systolic function was   mildly reduced. The estimated ejection fraction was in the range   of 45% to 50%. There is akinesis of the basal-midinferoseptal   myocardium. Doppler parameters are consistent with abnormal left   ventricular relaxation (grade 1 diastolic dysfunction). - Mitral valve: There was moderate  regurgitation. _____________   History of Present Illness     Mr. [name] is a 57 yo male with long hx of tobacco use who presented with acute onset mid sternal chest pain beginning at 7:30 pm the night prior to admission. Pain radiated to left shoulder and into throat. Associated with diaphoresis, N/V. Presented to ED the following am and Ecg showed ST elevation in inferior leads. No prior history of chest pain. No known medical history but doesn't go to see doctors. 2.5 ppd smoker. No history of DM, HTN, HLD. He was taken directly to the cath lab for emergent cardiac cath.   Hospital Course      1. CAD/Inferior ST elevation myocardial infarction: Admitted on 03/29/18 with an acute inferior STEMI. The RCA had a heavy thrombus burden and was treated with aspiration thrombectomy and stenting. LV gram with inferior wall motion abnormality, LVEF=45-50%. Echo with LVEF=45-50% with akinesis of the basal and mid inferoseptal wall. Grade 1 diastolic dysfunction. No further chest pain. Troponin peaked at >65 x3.  -- plan for DAPT with ASA, Brilinta for at least one year, along with statin and beta blocker.    2. Ischemic cardiomyopathy: LVEF=45% with inferior WMA post MI. Will continue beta blocker. No ARB or ace-inh due to soft BP. May be able to start at office f/u.   3. Nonsustained ventricular tachycardia: One three beat run of NSVT. Labs stable. Continue beta blocker.    4. Tobacco use: He is asked to stop smoking.   Robert Benson was seen by Dr. Clifton James and determined stable for discharge home. Follow up in the office has been arranged. Medications are  listed below.   _____________  Discharge Vitals Blood pressure 115/88, pulse 80, temperature 98.5 F (36.9 C), temperature source Oral, resp. rate 16, height 5\' 5"  (1.651 m), weight 61.7 kg, SpO2 97 %.  Filed Weights   03/29/18 0638 03/30/18 0500  Weight: 59 kg 61.7 kg    Labs & Radiologic Studies    CBC Recent Labs    03/30/18 0233  04/01/18 0252  WBC 10.8* 10.9*  HGB 14.3 14.2  HCT 43.7 43.5  MCV 93.6 93.1  PLT 190 200   Basic Metabolic Panel Recent Labs    16/10/96 0233 04/01/18 0252  NA 140 138  K 3.9 3.9  CL 108 105  CO2 23 19*  GLUCOSE 107* 100*  BUN 10 9  CREATININE 0.81 0.89  CALCIUM 8.4* 8.8*   Liver Function Tests Recent Labs    04/01/18 0252  AST 61*  ALT 44  ALKPHOS 57  BILITOT 1.1  PROT 6.2*  ALBUMIN 3.1*   No results for input(s): LIPASE, AMYLASE in the last 72 hours. Cardiac Enzymes Recent Labs    03/29/18 1435 03/29/18 2004  TROPONINI >65.00* >65.00*   BNP Invalid input(s): POCBNP D-Dimer No results for input(s): DDIMER in the last 72 hours. Hemoglobin A1C No results for input(s): HGBA1C in the last 72 hours. Fasting Lipid Panel Recent Labs    03/30/18 0233  CHOL 111  HDL 33*  LDLCALC 68  TRIG 52  CHOLHDL 3.4   Thyroid Function Tests No results for input(s): TSH, T4TOTAL, T3FREE, THYROIDAB in the last 72 hours.  Invalid input(s): FREET3 _____________  Dg Chest Port 1 View  Result Date: 03/29/2018 CLINICAL DATA:  Pain.  Code STEMI. EXAM: PORTABLE CHEST 1 VIEW COMPARISON:  None. FINDINGS: Lung volumes are low.The cardiomediastinal contours are normal. The lungs are clear. Pulmonary vasculature is normal. No consolidation, pleural effusion, or pneumothorax. No acute osseous abnormalities are seen. Degenerative change in the spine. IMPRESSION: Low lung volumes without acute finding. Electronically Signed   By: Narda Rutherford M.D.   On: 03/29/2018 06:56   Disposition   Pt is being discharged home today in good condition.  Follow-up Plans & Appointments    Follow-up Information    [facility] Follow up.   Why:  Pharmacy Services with prescriptions $4-10.  Contact information: [address] [place] [address] [phone]       [facility]. Go on 04/09/2018.   Specialty:  Internal  Medicine Why:  at 1pm for your hospital follow-up appointment. Contact information: [address] [place] [address] [phone]       Marcelino Duster, PA Follow up on 04/08/2018.   Specialties:  Physician Assistant, Cardiology, Radiology Why:  at 9:30am for your follow up appt.  Contact information: [address] [place] [address] [phone]          Discharge Instructions    Amb Referral to Cardiac Rehabilitation   Complete by:  As directed    To [place]   Diagnosis:   Coronary Stents STEMI PTCA     Call MD for:  redness, tenderness, or signs of infection (pain, swelling, redness, odor or green/yellow discharge around incision site)   Complete by:  As directed    Diet - low sodium heart healthy   Complete by:  As directed    Discharge instructions   Complete by:  As directed    No driving for 2 weeks.  No lifting over 10 lbs for 4 weeks. No sexual activity for 4 weeks. You may not return to work until cleared by your cardiologist. Keep procedure site clean & dry. If you notice increased pain, swelling, bleeding or pus, call/return!  You may shower, but no soaking baths/hot tubs/pools for 1 week.   PLEASE DO NOT MISS ANY DOSES OF YOUR BRILINTA!!!!! Also keep a log of you blood pressures and bring back to your follow up appt. Please call the office with any questions.   Patients taking blood thinners should generally stay away from medicines like ibuprofen, Advil, Motrin, naproxen, and Aleve due to risk of stomach bleeding. You may take Tylenol as directed or talk to your primary doctor about alternatives.   Increase activity slowly   Complete by:  As directed        Discharge Medications     Medication List    STOP taking these medications   BC FAST PAIN RELIEF 650-195-33.3 MG Pack Generic drug:  Aspirin-Salicylamide-Caffeine     TAKE these medications   aspirin 81 MG EC tablet Take 1 tablet (81 mg total) by mouth  daily. Start taking on:  04/02/2018   atorvastatin 80 MG tablet Commonly known as:  LIPITOR Take 1 tablet (80 mg total) by mouth daily at 6 PM.   carvedilol 6.25 MG tablet Commonly known as:  COREG Take 1 tablet (6.25 mg total) by mouth 2 (two) times daily with a meal.   nitroGLYCERIN 0.4 MG SL tablet Commonly known as:  NITROSTAT Place 1 tablet (0.4 mg total) under the tongue every 5 (five) minutes x 3 doses as needed for chest pain.   ROLAIDS 550-110 MG Chew Generic drug:  Ca Carbonate-Mag Hydroxide Chew 1 tablet by mouth as needed.   ticagrelor 90 MG Tabs tablet Commonly known as:  BRILINTA Take 1 tablet (90 mg total) by mouth 2 (two) times daily.      Acute coronary syndrome (MI, NSTEMI, STEMI, etc) this admission?: Yes.     AHA/ACC Clinical Performance & Quality Measures: 1. Aspirin prescribed? - Yes 2. ADP Receptor Inhibitor (Plavix/Clopidogrel, Brilinta/Ticagrelor or Effient/Prasugrel) prescribed (includes medically managed patients)? - Yes 3. Beta Blocker prescribed? - Yes 4. High Intensity Statin (Lipitor 40-80mg  or Crestor 20-40mg ) prescribed? - Yes 5. EF assessed during THIS hospitalization? - Yes 6. For EF <40%, was ACEI/ARB prescribed? - No - Reason:  blood pressures to soft.  7. For EF <40%, Aldosterone Antagonist (Spironolactone or Eplerenone) prescribed? - Not Applicable (EF >/= 40%) 8. Cardiac Rehab Phase II ordered (Included Medically managed Patients)? - Yes   Outstanding Labs/Studies   FLP/LFTs in 6 weeks.   Duration of Discharge Encounter   Greater than 30 minutes including physician time.  Signed, Laverda PageLindsay Roberts NP-C 04/01/2018, 12:11 PM

## 2018-04-01 NOTE — Progress Notes (Signed)
Progress Note  Patient Name: Robert Benson Date of Encounter: 04/01/2018  Primary Cardiologist: Peter SwazilandJordan, MD   Subjective   No chest pain or dyspnea.   Inpatient Medications    Scheduled Meds: . aspirin EC  81 mg Oral Daily  . atorvastatin  80 mg Oral q1800  . carvedilol  6.25 mg Oral BID WC  . enoxaparin (LOVENOX) injection  40 mg Subcutaneous Q24H  . sodium chloride flush  3 mL Intravenous Q12H  . ticagrelor  90 mg Oral BID   Continuous Infusions: . sodium chloride Stopped (03/30/18 0724)  . sodium chloride     PRN Meds: sodium chloride, acetaminophen, nitroGLYCERIN, ondansetron (ZOFRAN) IV, sodium chloride flush   Vital Signs    Vitals:   04/01/18 0255 04/01/18 0300 04/01/18 0400 04/01/18 0701  BP: 96/70   104/72  Pulse:      Resp: (!) 21   (!) 25  Temp:  98.5 F (36.9 C)    TempSrc:  Oral    SpO2:   97% 97%  Weight:      Height:        Intake/Output Summary (Last 24 hours) at 04/01/2018 0755 Last data filed at 03/31/2018 1200 Gross per 24 hour  Intake 248.21 ml  Output 500 ml  Net -251.79 ml   Filed Weights   03/29/18 0638 03/30/18 0500  Weight: 59 kg 61.7 kg    Telemetry     Sinus, one 3 beat run of NSVT at MN Personally Reviewed  ECG    No AM EKG. Personally Reviewed  Physical Exam    General: Well developed, well nourished, NAD  HEENT: OP clear, mucus membranes moist  SKIN: warm, dry. No rashes. Neuro: No focal deficits  Musculoskeletal: Muscle strength 5/5 all ext  Psychiatric: Mood and affect normal  Neck: No JVD, no carotid bruits, no thyromegaly, no lymphadenopathy.  Lungs:Clear bilaterally, no wheezes, rhonci, crackles Cardiovascular: Regular rate and rhythm. No murmurs, gallops or rubs. Abdomen:Soft. Bowel sounds present. Non-tender.  Extremities: No lower extremity edema. Pulses are 2 + in the bilateral DP/PT.    Labs    Chemistry Recent Labs  Lab 03/29/18 0645 03/29/18 0906 03/30/18 0233 04/01/18 0252  NA 136  137 140 138  K 3.8 3.5 3.9 3.9  CL 102 106 108 105  CO2 22 19* 23 19*  GLUCOSE 139* 124* 107* 100*  BUN 10 6 10 9   CREATININE 0.74 0.75 0.81 0.89  CALCIUM 9.2 8.5* 8.4* 8.8*  PROT 7.2 6.6  --  6.2*  ALBUMIN 4.0 3.7  --  3.1*  AST 135* 352*  --  61*  ALT 35 63*  --  44  ALKPHOS 70 71  --  57  BILITOT 0.9 1.0  --  1.1  GFRNONAA >60 >60 >60 >60  GFRAA >60 >60 >60 >60  ANIONGAP 12 12 9 14      Hematology Recent Labs  Lab 03/29/18 1600 03/30/18 0233 04/01/18 0252  WBC 14.4* 10.8* 10.9*  RBC 4.78 4.67 4.67  HGB 14.5 14.3 14.2  HCT 44.2 43.7 43.5  MCV 92.5 93.6 93.1  MCH 30.3 30.6 30.4  MCHC 32.8 32.7 32.6  RDW 12.9 13.2 12.5  PLT 218 190 200    Cardiac Enzymes Recent Labs  Lab 03/29/18 0645 03/29/18 0906 03/29/18 1435 03/29/18 2004  TROPONINI 14.77* >65.00* >65.00* >65.00*    Recent Labs  Lab 03/29/18 0645  TROPIPOC 14.14*     BNPNo results for input(s): BNP, PROBNP in  the last 168 hours.   DDimer No results for input(s): DDIMER in the last 168 hours.   Radiology    No results found.  Cardiac Studies   Cardiac catheterization 03/29/2018: 1. Single vessel occlusive CAD involving the proximal RCA with large thrombus burden. 2. Mild LV dysfunction with inferior wall motion abnormality 3. Normal LVEDP 4. Successful PCI of the RCA with aspiration thrombectomy and stenting with DES x 1.  Plan: DAPT for one year. IV Aggrastat for 18 hours. High dose statin. May be a candidate for fast track DC if no complications.   Recommend uninterrupted dual antiplatelet therapy with Aspirin 81mg  daily and Ticagrelor 90mg  twice daily for a minimum of 12 months (ACS - Class I recommendation).  Echo 03/31/18: - Left ventricle: The cavity size was normal. Systolic function was   mildly reduced. The estimated ejection fraction was in the range   of 45% to 50%. There is akinesis of the basal-midinferoseptal   myocardium. Doppler parameters are consistent with abnormal left    ventricular relaxation (grade 1 diastolic dysfunction). - Mitral valve: There was moderate regurgitation.  Patient Profile     57 y.o. male with inferior ST elevation myocardial infarction RCA stent mildly reduced ejection fraction 45% on heart catheterization with nonsustained ventricular tachycardia  Assessment & Plan    1. CAD/Inferior ST elevation myocardial infarction: Admitted on 03/29/18 with an acute inferior STEMI. The RCA had a heavy thrombus burden and was treated with aspiration thrombectomy and stenting. LV gram with inferior wall motion abnormality, LVEF=45-50%. Echo with LVEF=45-50% with akinesis of the basal and mid inferoseptal wall. Grade 1 diastolic dysfunction.  He is doing well. No chest pain. Continue ASA, Brilinta, statin and beta blocker.    2. Ischemic cardiomyopathy: LVEF=45% with inferior WMA post MI. Will continue beta blocker. No ARB or ace-inh due to soft BP. May be able to start at office f/u.   3. Nonsustained ventricular tachycardia: One three beat run of NSVT. Continue beta blocker.    4. Tobacco use: He is asked to stop smoking.   Discharge today. Follow up 1 week with office APP at [facility].  (Dr. Swaziland)  For questions or updates, please contact [facility] Please consult [email or web address] for contact info under        Signed, Verne Carrow, MD  04/01/2018, 7:55 AM

## 2018-04-07 NOTE — Progress Notes (Signed)
Cardiology Office Note:    Date:  04/08/2018   ID:  [name], DOB Jan 14, 1961, MRN [id number]  PCP:  Patient, No Pcp Per  Cardiologist:  Peter Swaziland, MD   Referring MD: Clayborn Heron, MD   Chief Complaint  Patient presents with  . Hospitalization Follow-up    STEMI, DES to RCA    History of Present Illness:    Robert Benson is a 57 y.o. male with no prior medical history presented with acute onset on 03/29/2018.  EKG with acute inferior STEMI he was taken emergently to the Cath Lab.  Heart cath revealed RCA with a heavy thrombus burden treated with aspiration thrombectomy and DES.  LV gram with inferior wall motion abnormality and LVEF of 45-50%, confirmed with echocardiogram.  Grade 1 diastolic dysfunction.  He was discharged on dual antiplatelet therapy with aspirin and Brilinta for at least one year.  He was also discharged on statin and beta-blocker.  Post intervention, he had nonsustained ventricular tachycardia.  He was discharged on 04/01/2018.  He returns today for hospital follow-up. Since heart cath, he has done well. He denies chest pain, SOB, DOE, orthopnea, and lower extremity swelling. He is compliant on medications and is having no bleeding problems on ASA and brilinta. His pressures have been marginal at home, but he is asymptomatic.    Past Medical History:  Diagnosis Date  . Arthritis   . Hyperlipidemia   . Hypertension   . STEMI (ST elevation myocardial infarction) [facility])     Past Surgical History:  Procedure Laterality Date  . CORONARY/GRAFT ACUTE MI REVASCULARIZATION N/A 03/29/2018   Procedure: Coronary/Graft Acute MI Revascularization;  Surgeon: Swaziland, Peter M, MD;  Location[facility] INVASIVE CV LAB;  Service: Cardiovascular;  Laterality: N/A;  . HARDWARE REMOVAL Right 11/03/2014   Procedure: HARDWARE REMOVAL from middle finger;  Surgeon: Bradly Bienenstock, MD;  Location: [facility] OR;  Service: Orthopedics;  Laterality: Right;  . I&D EXTREMITY Right 08/23/2014   Procedure: EXPLORATION RIGHT HAND, THUMB INDEX, LONG AND RING FINGER, Index finger amputation, IP Fusion of Long Finger, ORIF of Thumb;  Surgeon: Bradly Bienenstock, MD;  Location: [facility] OR;  Service: Orthopedics;  Laterality: Right;  . LEFT HEART CATH AND CORONARY ANGIOGRAPHY N/A 03/29/2018   Procedure: LEFT HEART CATH AND CORONARY ANGIOGRAPHY;  Surgeon: Swaziland, Peter M, MD;  Location: [facility] INVASIVE CV LAB;  Service: Cardiovascular;  Laterality: N/A;  . NERVE, TENDON AND ARTERY REPAIR Right 08/23/2014   Procedure: NERVE, TENDON, BONE AND ARTERY REPAIR AS NECESSARY;  Surgeon: Bradly Bienenstock, MD;  Location: [facility] OR;  Service: Orthopedics;  Laterality: Right;  . OPEN REDUCTION INTERNAL FIXATION (ORIF) HAND Right 08/23/2014   Procedure: OPEN REDUCTION INTERNAL FIXATION (ORIF) HAND;  Surgeon: Bradly Bienenstock, MD;  Location: [facility] OR;  Service: Orthopedics;  Laterality: Right;    Current Medications: Current Meds  Medication Sig  . aspirin EC 81 MG EC tablet Take 1 tablet (81 mg total) by mouth daily.  Marland Kitchen atorvastatin (LIPITOR) 80 MG tablet Take 1 tablet (80 mg total) by mouth daily at 6 PM.  . carvedilol (COREG) 6.25 MG tablet Take 1 tablet (6.25 mg total) by mouth 2 (two) times daily with a meal.  . nitroGLYCERIN (NITROSTAT) 0.4 MG SL tablet Place 1 tablet (0.4 mg total) under the tongue every 5 (five) minutes x 3 doses as needed for chest pain.  . ticagrelor (BRILINTA) 90 MG TABS tablet Take 1 tablet (90 mg total) by mouth 2 (two) times daily.  Allergies:   Morphine and related and Percocet [oxycodone-acetaminophen]   Social History   Socioeconomic History  . Marital status: Married    Spouse name: Not on file  . Number of children: Not on file  . Years of education: Not on file  . Highest education level: Not on file  Occupational History  . Occupation: roofer/construction  Social Needs  . Financial resource strain: Not on file  . Food insecurity:    Worry: Not on file    Inability: Not on file  .  Transportation needs:    Medical: Not on file    Non-medical: Not on file  Tobacco Use  . Smoking status: Former Smoker    Packs/day: 2.50    Years: 38.00    Pack years: 95.00  . Smokeless tobacco: Never Used  Substance and Sexual Activity  . Alcohol use: No  . Drug use: Yes    Types: Marijuana    Comment: 11/02/14- last time" this am"  . Sexual activity: Not on file  Lifestyle  . Physical activity:    Days per week: Not on file    Minutes per session: Not on file  . Stress: Not on file  Relationships  . Social connections:    Talks on phone: Not on file    Gets together: Not on file    Attends religious service: Not on file    Active member of club or organization: Not on file    Attends meetings of clubs or organizations: Not on file    Relationship status: Not on file  Other Topics Concern  . Not on file  Social History Narrative  . Not on file     Family History: The patient's family history includes Hypertension in his father.  ROS:   Please see the history of present illness.    All other systems reviewed and are negative.  EKGs/Labs/Other Studies Reviewed:    The following studies were reviewed today:  Cath: 03/29/18   Prox RCA lesion is 99% stenosed.  Post intervention, there is a 0% residual stenosis.  A drug-eluting stent was successfully placed using a STENT SYNERGY DES 3.5X16.  There is mild left ventricular systolic dysfunction.  LV end diastolic pressure is normal.  The left ventricular ejection fraction is 45-50% by visual estimate.  1. Single vessel occlusive CAD involving the proximal RCA with large thrombus burden. 2. Mild LV dysfunction with inferior wall motion abnormality 3. Normal LVEDP 4. Successful PCI of the RCA with aspiration thrombectomy and stenting with DES x 1.  Plan: DAPT for one year. IV Aggrastat for 18 hours. High dose statin. May be a candidate for fast track DC if no complications.   Recommend uninterrupted dual  antiplatelet therapy with Aspirin 81mg  daily and Ticagrelor 90mg  twice daily for a minimum of 12 months (ACS - Class I recommendation).   TTE: 03/31/18 Study Conclusions - Left ventricle: The cavity size was normal. Systolic function was mildly reduced. The estimated ejection fraction was in the range of 45% to 50%. There is akinesis of the basal-midinferoseptal myocardium. Doppler parameters are consistent with abnormal left ventricular relaxation (grade 1 diastolic dysfunction). - Mitral valve: There was moderate regurgitation   EKG:  EKG is ordered today.  The ekg ordered today demonstrates sinus with ST elevation inferior leads, new since discharge  Recent Labs: 03/29/2018: Magnesium 2.0; TSH 0.541 04/01/2018: ALT 44; BUN 9; Creatinine, Ser 0.89; Hemoglobin 14.2; Platelets 200; Potassium 3.9; Sodium 138  Recent Lipid Panel  Component Value Date/Time   CHOL 111 03/30/2018 0233   TRIG 52 03/30/2018 0233   HDL 33 (L) 03/30/2018 0233   CHOLHDL 3.4 03/30/2018 0233   VLDL 10 03/30/2018 0233   LDLCALC 68 03/30/2018 0233    Physical Exam:    VS:  BP 105/67   Pulse 62   Ht 5\' 7"  (1.702 m)   Wt 134 lb 12.8 oz (61.1 kg)   BMI 21.11 kg/m     Wt Readings from Last 3 Encounters:  04/08/18 134 lb 12.8 oz (61.1 kg)  03/30/18 136 lb 0.4 oz (61.7 kg)  11/03/14 138 lb 5 oz (62.7 kg)     GEN: Well nourished, well developed in no acute distress HEENT: Normal NECK: No JVD; No carotid bruits CARDIAC: RRR, no murmurs, rubs, gallops RESPIRATORY:  Clear to auscultation without rales, wheezing or rhonchi  ABDOMEN: Soft, non-tender, non-distended MUSCULOSKELETAL:  No edema; No deformity right radial C/D/I SKIN: Warm and dry NEUROLOGIC:  Alert and oriented x 3 PSYCHIATRIC:  Normal affect   ASSESSMENT:    1. Coronary artery disease involving native coronary artery of native heart without angina pectoris   2. Abnormal EKG   3. Hyperlipidemia, unspecified hyperlipidemia type    4. STEMI involving right coronary artery (HCC)   5. Essential hypertension   6. Tobacco abuse    PLAN:    In order of problems listed above:  Coronary artery disease involving native coronary artery of native heart without angina pectoris - Plan: EKG 12-Lead STEMI s/p thrombectomy and DES to RCA Abnormal EKG He is asymptomatic. EKG reveals ST elevation in inferior leads which are new since discharge. EKG reviewed with Dr. Rennis GoldenHilty, DOD. Evidence of infarct. Concern for development of LV aneurysm. Management: check repeat echocardiogram in 6 months prior to next appt to eval LV aneurysm. We discussed when he should return to the ER.    Essential Hypertension Pressures have been marginal on 6.25 mg BID coreg. He is asymptomatic. I will make no medication changes.    Hyperlipidemia, unspecified hyperlipidemia type - Plan: Hepatic function panel 03/30/2018: Cholesterol 111; HDL 33; LDL Cholesterol 68; Triglycerides 52; VLDL 10 LFTs were slightly elevated during his hospitalization. Will recheck these today.   Tobacco abuse He has quit smoking cigarettes and marijuana.    Follow up in 6 months with echo.   Medication Adjustments/Labs and Tests Ordered: Current medicines are reviewed at length with the patient today.  Concerns regarding medicines are outlined above.  Orders Placed This Encounter  Procedures  . Hepatic function panel  . EKG 12-Lead  . ECHOCARDIOGRAM COMPLETE   No orders of the defined types were placed in this encounter.   Signed, Marcelino Dusterngela Nicole Carolos Fecher, [place]  04/08/2018 9:57 AM    [facility]

## 2018-04-08 ENCOUNTER — Ambulatory Visit (INDEPENDENT_AMBULATORY_CARE_PROVIDER_SITE_OTHER): Payer: Self-pay | Admitting: Physician Assistant

## 2018-04-08 ENCOUNTER — Encounter: Payer: Self-pay | Admitting: Physician Assistant

## 2018-04-08 VITALS — BP 105/67 | HR 62 | Ht 67.0 in | Wt 134.8 lb

## 2018-04-08 DIAGNOSIS — Z720 Tobacco use: Secondary | ICD-10-CM

## 2018-04-08 DIAGNOSIS — I2510 Atherosclerotic heart disease of native coronary artery without angina pectoris: Secondary | ICD-10-CM

## 2018-04-08 DIAGNOSIS — E785 Hyperlipidemia, unspecified: Secondary | ICD-10-CM

## 2018-04-08 DIAGNOSIS — I2111 ST elevation (STEMI) myocardial infarction involving right coronary artery: Secondary | ICD-10-CM

## 2018-04-08 DIAGNOSIS — I10 Essential (primary) hypertension: Secondary | ICD-10-CM

## 2018-04-08 DIAGNOSIS — R9431 Abnormal electrocardiogram [ECG] [EKG]: Secondary | ICD-10-CM

## 2018-04-08 LAB — HEPATIC FUNCTION PANEL
ALT: 22 IU/L (ref 0–44)
AST: 17 IU/L (ref 0–40)
Albumin: 4.2 g/dL (ref 3.5–5.5)
Alkaline Phosphatase: 88 IU/L (ref 39–117)
BILIRUBIN TOTAL: 0.4 mg/dL (ref 0.0–1.2)
BILIRUBIN, DIRECT: 0.14 mg/dL (ref 0.00–0.40)
TOTAL PROTEIN: 6.8 g/dL (ref 6.0–8.5)

## 2018-04-08 NOTE — Patient Instructions (Addendum)
Medication Instructions:  Your physician recommends that you continue on your current medications as directed. Please refer to the Current Medication list given to you today.   Labwork: Hepatic panel today  Testing/Procedures: Your physician has requested that you have an echocardiogram. Echocardiography is a painless test that uses sound waves to create images of your heart. It provides your doctor with information about the size and shape of your heart and how well your heart's chambers and valves are working. This procedure takes approximately one hour. There are no restrictions for this procedure. (To be scheduled in 6 months prior to your office visit)  Follow-Up: Your physician wants you to follow-up in: 6 months with Dr.Jordan You will receive a reminder letter in the mail two months in advance. If you don't receive a letter, please call our office to schedule the follow-up appointment.   Any Other Special Instructions Will Be Listed Below (If Applicable).     If you need a refill on your cardiac medications before your next appointment, please call your pharmacy.

## 2018-04-09 ENCOUNTER — Ambulatory Visit (INDEPENDENT_AMBULATORY_CARE_PROVIDER_SITE_OTHER): Payer: Self-pay | Admitting: Family Medicine

## 2018-04-09 ENCOUNTER — Encounter: Payer: Self-pay | Admitting: Family Medicine

## 2018-04-09 ENCOUNTER — Other Ambulatory Visit: Payer: Self-pay

## 2018-04-09 VITALS — BP 103/84 | HR 70 | Temp 98.4°F | Resp 18 | Ht 67.0 in | Wt 134.2 lb

## 2018-04-09 DIAGNOSIS — Z1159 Encounter for screening for other viral diseases: Secondary | ICD-10-CM

## 2018-04-09 DIAGNOSIS — F5102 Adjustment insomnia: Secondary | ICD-10-CM

## 2018-04-09 DIAGNOSIS — Z9189 Other specified personal risk factors, not elsewhere classified: Secondary | ICD-10-CM

## 2018-04-09 DIAGNOSIS — I10 Essential (primary) hypertension: Secondary | ICD-10-CM

## 2018-04-09 DIAGNOSIS — I2111 ST elevation (STEMI) myocardial infarction involving right coronary artery: Secondary | ICD-10-CM

## 2018-04-09 MED ORDER — ATORVASTATIN CALCIUM 80 MG PO TABS: 80.0000 mg | ORAL_TABLET | Freq: Every day | ORAL | 1 refills | Status: DC

## 2018-04-09 MED ORDER — CARVEDILOL 6.25 MG PO TABS: 6.2500 mg | ORAL_TABLET | Freq: Two times a day (BID) | ORAL | 1 refills | Status: DC

## 2018-04-09 MED ORDER — TICAGRELOR 90 MG PO TABS: 90.0000 mg | ORAL_TABLET | Freq: Two times a day (BID) | ORAL | 2 refills | Status: DC

## 2018-04-09 MED ORDER — HYDROXYZINE PAMOATE 25 MG PO CAPS: 25.0000 mg | ORAL_CAPSULE | Freq: Every evening | ORAL | 0 refills | Status: DC | PRN

## 2018-04-09 NOTE — Progress Notes (Signed)
[facility]  New Patient Encounter Provider: Mike Gip, [place]    ZOX:[id number]  WJX:[id number]  DOB - 10-Apr-1961  SUBJECTIVE:   Robert Benson, is a 57 y.o. male who presents to establish care with this clinic.   Current problems/concerns:  Patient with recent STEMI and emergency cath. Accompanied by wife. She states that he is having difficulty with sleeping. He states that he has had mild anxiety. Denies history of insomnia prior to the STEMI.  Patient seen by A. Duke, PA in cardiology. Instructed to follow up in 6 months.  Allergies  Allergen Reactions  . Morphine And Related Nausea And Vomiting  . Percocet [Oxycodone-Acetaminophen] Itching and Nausea And Vomiting   Past Medical History:  Diagnosis Date  . Arthritis   . Hyperlipidemia   . Hypertension   . STEMI (ST elevation myocardial infarction) [facility])    Current Outpatient Medications on File Prior to Visit  Medication Sig Dispense Refill  . aspirin EC 81 MG EC tablet Take 1 tablet (81 mg total) by mouth daily.    . nitroGLYCERIN (NITROSTAT) 0.4 MG SL tablet Place 1 tablet (0.4 mg total) under the tongue every 5 (five) minutes x 3 doses as needed for chest pain. 25 tablet 2   No current facility-administered medications on file prior to visit.    Family History  Problem Relation Age of Onset  . Hypertension Father    Social History   Socioeconomic History  . Marital status: Married    Spouse name: Not on file  . Number of children: Not on file  . Years of education: Not on file  . Highest education level: Not on file  Occupational History  . Occupation: roofer/construction  Social Needs  . Financial resource strain: Not on file  . Food insecurity:    Worry: Not on file    Inability: Not on file  . Transportation needs:    Medical: Not on file    Non-medical: Not on file  Tobacco Use  . Smoking status: Former Smoker    Packs/day: 2.50    Years: 38.00   Pack years: 95.00  . Smokeless tobacco: Never Used  Substance and Sexual Activity  . Alcohol use: No  . Drug use: Yes    Types: Marijuana    Comment: 11/02/14- last time" this am"  . Sexual activity: Not on file  Lifestyle  . Physical activity:    Days per week: Not on file    Minutes per session: Not on file  . Stress: Not on file  Relationships  . Social connections:    Talks on phone: Not on file    Gets together: Not on file    Attends religious service: Not on file    Active member of club or organization: Not on file    Attends meetings of clubs or organizations: Not on file    Relationship status: Not on file  . Intimate partner violence:    Fear of current or ex partner: Not on file    Emotionally abused: Not on file    Physically abused: Not on file    Forced sexual activity: Not on file  Other Topics Concern  . Not on file  Social History Narrative  . Not on file    Review of Systems  Constitutional: Negative.   HENT: Negative.   Eyes: Negative.   Respiratory: Negative.   Cardiovascular: Negative.   Gastrointestinal: Negative.   Genitourinary: Negative.  Musculoskeletal: Negative.   Skin: Negative.   Neurological: Negative.   Psychiatric/Behavioral: The patient has insomnia.      OBJECTIVE:    BP 103/84 (BP Location: Left Arm, Patient Position: Sitting, Cuff Size: Normal)   Pulse 70   Temp 98.4 F (36.9 C) (Oral)   Resp 18   Ht 5\' 7"  (1.702 m)   Wt 134 lb 3.2 oz (60.9 kg)   SpO2 100%   BMI 21.02 kg/m   Physical Exam  Constitutional: He is oriented to person, place, and time and well-developed, well-nourished, and in no distress. No distress.  HENT:  Head: Normocephalic and atraumatic.  Eyes: Pupils are equal, round, and reactive to light. Conjunctivae and EOM are normal.  Neck: Normal range of motion. Neck supple.  Cardiovascular: Normal rate, regular rhythm and intact distal pulses. Exam reveals no gallop and no friction rub.  No murmur  heard. Pulmonary/Chest: Effort normal and breath sounds normal. No respiratory distress. He has no wheezes.  Abdominal: Soft. Bowel sounds are normal. There is no tenderness.  Musculoskeletal: Normal range of motion. He exhibits no edema or tenderness.  Lymphadenopathy:    He has no cervical adenopathy.  Neurological: He is alert and oriented to person, place, and time. Gait normal.  Skin: Skin is warm and dry.  Psychiatric: Mood, memory, affect and judgment normal.  Nursing note and vitals reviewed.    ASSESSMENT/PLAN:  1. Encounter for hepatitis C virus screening test for high risk patient Pending labs. Will adjust medications accordingly.   - Comprehensive metabolic panel - Hepatitis c antibody (reflex)  2. STEMI involving right coronary artery (HCC) - atorvastatin (LIPITOR) 80 MG tablet; Take 1 tablet (80 mg total) by mouth daily at 6 PM.  Dispense: 90 tablet; Refill: 1 - ticagrelor (BRILINTA) 90 MG TABS tablet; Take 1 tablet (90 mg total) by mouth 2 (two) times daily.  Dispense: 180 tablet; Refill: 2  3. Essential hypertension - Comprehensive metabolic panel - carvedilol (COREG) 6.25 MG tablet; Take 1 tablet (6.25 mg total) by mouth 2 (two) times daily with a meal.  Dispense: 180 tablet; Refill: 1 - atorvastatin (LIPITOR) 80 MG tablet; Take 1 tablet (80 mg total) by mouth daily at 6 PM.  Dispense: 90 tablet; Refill: 1 - ticagrelor (BRILINTA) 90 MG TABS tablet; Take 1 tablet (90 mg total) by mouth 2 (two) times daily.  Dispense: 180 tablet; Refill: 2  4. Adjustment insomnia Discussed anxiety is normal after major life event. Discussed sleep hygiene and evening routine.  - hydrOXYzine (VISTARIL) 25 MG capsule; Take 1-2 capsules (25-50 mg total) by mouth at bedtime as needed.  Dispense: 60 capsule; Refill: 0    Return in about 6 months (around 10/08/2018).  The patient was given clear instructions to go to ER or return to medical center if symptoms don't improve, worsen or new  problems develop. The patient verbalized understanding. The patient was told to call to get lab results if they haven't heard anything in the next week.     This note has been created with Education officer, environmental. Any transcriptional errors are unintentional.   Ms. Andr L. Riley Lam, FNP-BC [facility] [address] [place], [place] [address] [phone]

## 2018-04-09 NOTE — Patient Instructions (Signed)
I am sending Vistaril to the pharmacy. This is for sleep and anxiety. You can use it as needed. Continue with a heart healthy diet.  Heart-Healthy Eating Plan Heart-healthy meal planning includes:  Limiting unhealthy fats.  Increasing healthy fats.  Making other small dietary changes.  You may need to talk with your doctor or a diet specialist (dietitian) to create an eating plan that is right for you. What types of fat should I choose?  Choose healthy fats. These include olive oil and canola oil, flaxseeds, walnuts, almonds, and seeds.  Eat more omega-3 fats. These include salmon, mackerel, sardines, tuna, flaxseed oil, and ground flaxseeds. Try to eat fish at least twice each week.  Limit saturated fats. ? Saturated fats are often found in animal products, such as meats, butter, and cream. ? Plant sources of saturated fats include palm oil, palm kernel oil, and coconut oil.  Avoid foods with partially hydrogenated oils in them. These include stick margarine, some tub margarines, cookies, crackers, and other baked goods. These contain trans fats. What general guidelines do I need to follow?  Check food labels carefully. Identify foods with trans fats or high amounts of saturated fat.  Fill one half of your plate with vegetables and green salads. Eat 4-5 servings of vegetables per day. A serving of vegetables is: ? 1 cup of raw leafy vegetables. ?  cup of raw or cooked cut-up vegetables. ?  cup of vegetable juice.  Fill one fourth of your plate with whole grains. Look for the word "whole" as the first word in the ingredient list.  Fill one fourth of your plate with lean protein foods.  Eat 4-5 servings of fruit per day. A serving of fruit is: ? One medium whole fruit. ?  cup of dried fruit. ?  cup of fresh, frozen, or canned fruit. ?  cup of 100% fruit juice.  Eat more foods that contain soluble fiber. These include apples, broccoli, carrots, beans, peas, and barley. Try  to get 20-30 g of fiber per day.  Eat more home-cooked food. Eat less restaurant, buffet, and fast food.  Limit or avoid alcohol.  Limit foods high in starch and sugar.  Avoid fried foods.  Avoid frying your food. Try baking, boiling, grilling, or broiling it instead. You can also reduce fat by: ? Removing the skin from poultry. ? Removing all visible fats from meats. ? Skimming the fat off of stews, soups, and gravies before serving them. ? Steaming vegetables in water or broth.  Lose weight if you are overweight.  Eat 4-5 servings of nuts, legumes, and seeds per week: ? One serving of dried beans or legumes equals  cup after being cooked. ? One serving of nuts equals 1 ounces. ? One serving of seeds equals  ounce or one tablespoon.  You may need to keep track of how much salt or sodium you eat. This is especially true if you have high blood pressure. Talk with your doctor or dietitian to get more information. What foods can I eat? Grains Breads, including [place], white, pita, wheat, raisin, rye, oatmeal, and [place]. Tortillas that are neither fried nor made with lard or trans fat. Low-fat rolls, including hotdog and hamburger buns and English muffins. Biscuits. Muffins. Waffles. Pancakes. Light popcorn. Whole-grain cereals. Flatbread. Melba toast. Pretzels. Breadsticks. Rusks. Low-fat snacks. Low-fat crackers, including oyster, saltine, matzo, graham, animal, and rye. Rice and pasta, including brown rice and pastas that are made with whole wheat. Vegetables All vegetables. Fruits  All fruits, but limit coconut. Meats and Other Protein Sources Lean, well-trimmed beef, veal, pork, and lamb. Chicken and [place] without skin. All fish and shellfish. Wild duck, rabbit, pheasant, and venison. Egg whites or low-cholesterol egg substitutes. Dried beans, peas, lentils, and tofu. Seeds and most nuts. Dairy Low-fat or nonfat cheeses, including ricotta, string, and mozzarella. Skim or 1%  milk that is liquid, powdered, or evaporated. Buttermilk that is made with low-fat milk. Nonfat or low-fat yogurt. Beverages Mineral water. Diet carbonated beverages. Sweets and Desserts Sherbets and fruit ices. Honey, jam, marmalade, jelly, and syrups. Meringues and gelatins. Pure sugar candy, such as hard candy, jelly beans, gumdrops, mints, marshmallows, and small amounts of dark chocolate. [employer]. Eat all sweets and desserts in moderation. Fats and Oils Nonhydrogenated (trans-free) margarines. Vegetable oils, including soybean, sesame, sunflower, olive, peanut, safflower, corn, canola, and cottonseed. Salad dressings or mayonnaise made with a vegetable oil. Limit added fats and oils that you use for cooking, baking, salads, and as spreads. Other Cocoa powder. Coffee and tea. All seasonings and condiments. The items listed above may not be a complete list of recommended foods or beverages. Contact your dietitian for more options. What foods are not recommended? Grains Breads that are made with saturated or trans fats, oils, or whole milk. Croissants. Butter rolls. Cheese breads. Sweet rolls. Donuts. Buttered popcorn. Chow mein noodles. High-fat crackers, such as cheese or butter crackers. Meats and Other Protein Sources Fatty meats, such as hotdogs, short ribs, sausage, spareribs, bacon, rib eye roast or steak, and mutton. High-fat deli meats, such as salami and bologna. Caviar. Domestic duck and goose. Organ meats, such as kidney, liver, sweetbreads, and heart. Dairy Cream, sour cream, cream cheese, and creamed cottage cheese. Whole-milk cheeses, including blue (bleu), [address], [place], [place], [address], [place], [address], cheddar, [place], and [place]. Whole or 2% milk that is liquid, evaporated, or condensed. Whole buttermilk. Cream sauce or high-fat cheese sauce. Yogurt that is made from whole milk. Beverages Regular sodas and juice drinks with added sugar. Sweets and  Desserts Frosting. Pudding. Cookies. Cakes other than angel food cake. Candy that has milk chocolate or white chocolate, hydrogenated fat, butter, coconut, or unknown ingredients. Buttered syrups. Full-fat ice cream or ice cream drinks. Fats and Oils Gravy that has suet, meat fat, or shortening. Cocoa butter, hydrogenated oils, palm oil, coconut oil, palm kernel oil. These can often be found in baked products, candy, fried foods, nondairy creamers, and whipped toppings. Solid fats and shortenings, including bacon fat, salt pork, lard, and butter. Nondairy cream substitutes, such as coffee creamers and sour cream substitutes. Salad dressings that are made of unknown oils, cheese, or sour cream. The items listed above may not be a complete list of foods and beverages to avoid. Contact your dietitian for more information. This information is not intended to replace advice given to you by your health care provider. Make sure you discuss any questions you have with your health care provider. Document Released: 01/01/2012 Document Revised: 12/08/2015 Document Reviewed: 12/24/2013 Elsevier Interactive Patient Education  2018 [employer].     Hydroxyzine capsules or tablets What is this medicine? HYDROXYZINE (hye DROX i zeen) is an antihistamine. This medicine is used to treat allergy symptoms. It is also used to treat anxiety and tension. This medicine can be used with other medicines to induce sleep before surgery. This medicine may be used for other purposes; ask your health care provider or pharmacist if you have questions. COMMON BRAND NAME(S): ANX, Atarax,  Rezine, Vistaril What should I tell my health care provider before I take this medicine? They need to know if you have any of these conditions: -any chronic illness -difficulty passing urine -glaucoma -heart disease -kidney disease -liver disease -lung disease -an unusual or allergic reaction to hydroxyzine, cetirizine, other medicines,  foods, dyes, or preservatives -pregnant or trying to get pregnant -breast-feeding How should I use this medicine? Take this medicine by mouth with a full glass of water. Follow the directions on the prescription label. You may take this medicine with food or on an empty stomach. Take your medicine at regular intervals. Do not take your medicine more often than directed. Talk to your pediatrician regarding the use of this medicine in children. Special care may be needed. While this drug may be prescribed for children as young as 46 years of age for selected conditions, precautions do apply. Patients over 75 years old may have a stronger reaction and need a smaller dose. Overdosage: If you think you have taken too much of this medicine contact a poison control center or emergency room at once. NOTE: This medicine is only for you. Do not share this medicine with others. What if I miss a dose? If you miss a dose, take it as soon as you can. If it is almost time for your next dose, take only that dose. Do not take double or extra doses. What may interact with this medicine? -alcohol -barbiturate medicines for sleep or seizures -medicines for colds, allergies -medicines for depression, anxiety, or emotional disturbances -medicines for pain -medicines for sleep -muscle relaxants This list may not describe all possible interactions. Give your health care provider a list of all the medicines, herbs, non-prescription drugs, or dietary supplements you use. Also tell them if you smoke, drink alcohol, or use illegal drugs. Some items may interact with your medicine. What should I watch for while using this medicine? Tell your doctor or health care professional if your symptoms do not improve. You may get drowsy or dizzy. Do not drive, use machinery, or do anything that needs mental alertness until you know how this medicine affects you. Do not stand or sit up quickly, especially if you are an older patient.  This reduces the risk of dizzy or fainting spells. Alcohol may interfere with the effect of this medicine. Avoid alcoholic drinks. Your mouth may get dry. Chewing sugarless gum or sucking hard candy, and drinking plenty of water may help. Contact your doctor if the problem does not go away or is severe. This medicine may cause dry eyes and blurred vision. If you wear contact lenses you may feel some discomfort. Lubricating drops may help. See your eye doctor if the problem does not go away or is severe. If you are receiving skin tests for allergies, tell your doctor you are using this medicine. What side effects may I notice from receiving this medicine? Side effects that you should report to your doctor or health care professional as soon as possible: -fast or irregular heartbeat -difficulty passing urine -seizures -slurred speech or confusion -tremor Side effects that usually do not require medical attention (report to your doctor or health care professional if they continue or are bothersome): -constipation -drowsiness -fatigue -headache -stomach upset This list may not describe all possible side effects. Call your doctor for medical advice about side effects. You may report side effects to FDA at 1-800-FDA-1088. Where should I keep my medicine? Keep out of the reach of children. Store at room  temperature between 15 and 30 degrees C (59 and 86 degrees F). Keep container tightly closed. Throw away any unused medicine after the expiration date. NOTE: This sheet is a summary. It may not cover all possible information. If you have questions about this medicine, talk to your doctor, pharmacist, or health care provider.  2018 Elsevier/Gold Standard (2007-11-14 14:50:59)

## 2018-04-10 LAB — COMPREHENSIVE METABOLIC PANEL
ALT: 19 IU/L (ref 0–44)
AST: 16 IU/L (ref 0–40)
Albumin/Globulin Ratio: 1.7 (ref 1.2–2.2)
Albumin: 4.1 g/dL (ref 3.5–5.5)
Alkaline Phosphatase: 90 IU/L (ref 39–117)
BUN/Creatinine Ratio: 17 (ref 9–20)
BUN: 16 mg/dL (ref 6–24)
Bilirubin Total: 0.4 mg/dL (ref 0.0–1.2)
CO2: 25 mmol/L (ref 20–29)
Calcium: 9.1 mg/dL (ref 8.7–10.2)
Chloride: 100 mmol/L (ref 96–106)
Creatinine, Ser: 0.96 mg/dL (ref 0.76–1.27)
GFR calc Af Amer: 102 mL/min/{1.73_m2} (ref >59)
GFR calc non Af Amer: 89 mL/min/{1.73_m2} (ref >59)
Globulin, Total: 2.4 g/dL (ref 1.5–4.5)
Glucose: 81 mg/dL (ref 65–99)
Potassium: 4.1 mmol/L (ref 3.5–5.2)
Sodium: 137 mmol/L (ref 134–144)
Total Protein: 6.5 g/dL (ref 6.0–8.5)

## 2018-04-10 LAB — HEPATITIS C ANTIBODY (REFLEX): HCV Ab: <0.1 s/co ratio (ref 0.0–0.9)

## 2018-04-10 LAB — HCV COMMENT:

## 2018-04-28 ENCOUNTER — Other Ambulatory Visit: Payer: Self-pay | Admitting: Cardiology

## 2018-04-28 ENCOUNTER — Other Ambulatory Visit: Payer: Self-pay

## 2018-04-28 DIAGNOSIS — I10 Essential (primary) hypertension: Secondary | ICD-10-CM

## 2018-04-28 DIAGNOSIS — I2111 ST elevation (STEMI) myocardial infarction involving right coronary artery: Secondary | ICD-10-CM

## 2018-04-28 DIAGNOSIS — F5102 Adjustment insomnia: Secondary | ICD-10-CM

## 2018-04-28 MED ORDER — ATORVASTATIN CALCIUM 80 MG PO TABS: 80.0000 mg | ORAL_TABLET | Freq: Every day | ORAL | 1 refills | Status: DC

## 2018-04-28 MED ORDER — CARVEDILOL 6.25 MG PO TABS: 6.2500 mg | ORAL_TABLET | Freq: Two times a day (BID) | ORAL | 1 refills | Status: DC

## 2018-04-28 MED ORDER — TICAGRELOR 90 MG PO TABS: 90.0000 mg | ORAL_TABLET | Freq: Two times a day (BID) | ORAL | 2 refills | Status: DC

## 2018-04-28 MED ORDER — HYDROXYZINE PAMOATE 25 MG PO CAPS: 25.0000 mg | ORAL_CAPSULE | Freq: Every evening | ORAL | 0 refills | Status: DC | PRN

## 2018-04-28 MED ORDER — ASPIRIN 81 MG PO TBEC: 81.0000 mg | DELAYED_RELEASE_TABLET | Freq: Every day | ORAL | Status: AC

## 2018-04-28 MED FILL — ATORVASTATIN 80 MG TABLET: 80 | 30 days supply | Qty: 30 | Fill #0

## 2018-04-28 MED FILL — hydrOXYzine PAMOATE 25 MG C: 25 | 30 days supply | Qty: 60 | Fill #0

## 2018-04-28 MED FILL — CARVEDILOL 6.25 MG TABLET: 6.25 | 30 days supply | Qty: 60 | Fill #0

## 2018-04-28 MED FILL — BRILINTA 90 MG TABLET: 90 | 30 days supply | Qty: 60 | Fill #0

## 2018-06-02 ENCOUNTER — Other Ambulatory Visit: Payer: Self-pay | Admitting: Family Medicine

## 2018-06-02 DIAGNOSIS — F5102 Adjustment insomnia: Secondary | ICD-10-CM

## 2018-06-02 MED FILL — BRILINTA 90 MG TABLET: 90 | 30 days supply | Qty: 60 | Fill #1

## 2018-06-02 MED FILL — CARVEDILOL 6.25 MG TABLET: 6.25 | 30 days supply | Qty: 60 | Fill #1

## 2018-06-03 MED FILL — hydrOXYzine PAMOATE 25 MG C: 25 | 30 days supply | Qty: 60 | Fill #0

## 2018-07-04 MED FILL — BRILINTA 90 MG TABLET: 90 | 30 days supply | Qty: 60 | Fill #2

## 2018-07-04 MED FILL — CARVEDILOL 6.25 MG TABLET: 6.25 | 30 days supply | Qty: 60 | Fill #2

## 2018-08-08 MED FILL — ATORVASTATIN 80 MG TABLET: 80 | 30 days supply | Qty: 30 | Fill #1

## 2018-08-08 MED FILL — BRILINTA 90 MG TABLET: 90 | 30 days supply | Qty: 60 | Fill #3

## 2018-08-08 MED FILL — CARVEDILOL 6.25 MG TABLET: 6.25 | 30 days supply | Qty: 60 | Fill #3

## 2018-08-18 MED ORDER — SODIUM CHLORIDE (PF) 0.9 % IJ SOLN
INTRAMUSCULAR | Status: AC
  Filled 2018-08-18: qty 50

## 2018-09-08 MED FILL — CARVEDILOL 6.25 MG TABLET: 6.25 | 30 days supply | Qty: 60 | Fill #4

## 2018-09-08 MED FILL — ATORVASTATIN 80 MG TABLET: 80 | 30 days supply | Qty: 30 | Fill #2

## 2018-09-09 MED FILL — $BRILINTA 90 MG TABLET: 90 | 90 days supply | Qty: 180 | Fill #4

## 2018-10-06 ENCOUNTER — Other Ambulatory Visit (HOSPITAL_COMMUNITY): Payer: Self-pay

## 2018-10-08 ENCOUNTER — Other Ambulatory Visit: Payer: Self-pay

## 2018-10-08 ENCOUNTER — Telehealth (INDEPENDENT_AMBULATORY_CARE_PROVIDER_SITE_OTHER): Payer: Self-pay | Admitting: Family Medicine

## 2018-10-08 DIAGNOSIS — I2111 ST elevation (STEMI) myocardial infarction involving right coronary artery: Secondary | ICD-10-CM

## 2018-10-08 DIAGNOSIS — I10 Essential (primary) hypertension: Secondary | ICD-10-CM

## 2018-10-08 MED ORDER — ATORVASTATIN CALCIUM 80 MG PO TABS: 80.0000 mg | ORAL_TABLET | Freq: Every day | ORAL | 1 refills | Status: DC

## 2018-10-08 MED ORDER — TICAGRELOR 90 MG PO TABS: 90.0000 mg | ORAL_TABLET | Freq: Two times a day (BID) | ORAL | 2 refills | Status: DC

## 2018-10-08 MED ORDER — CARVEDILOL 6.25 MG PO TABS: 6.2500 mg | ORAL_TABLET | Freq: Two times a day (BID) | ORAL | 1 refills | Status: DC

## 2018-10-08 NOTE — Progress Notes (Signed)
  [facility]  Virtual Visit via Telephone Note  I connected with Robert Benson on 10/08/18 at 10:00 AM EDT by telephone and verified that I am speaking with the correct person using two identifiers.   I discussed the limitations, risks, security and privacy concerns of performing an evaluation and management service by telephone and the availability of in person appointments. I also discussed with the patient that there may be a patient responsible charge related to this service. The patient expressed understanding and agreed to proceed.   History of Present Illness:  Patient reports doing well on his medications. He states that he has been walking 1 mile two times per day. He states that he has been sleeping well and has not had to use vistaril to help with sleep since getting a new mattress. He states that he is checking his bp daily and they have been under 140/90. His wife states that he is having mild congestion and cough. He does not have a fever, chills, or SOB.    Observations/Objective: Patient's voice with normal tone, rate and rhythm. No acute distress noted.   Assessment and Plan:  1. STEMI involving right coronary artery (HCC) No medication changes warranted at the present time.   - atorvastatin (LIPITOR) 80 MG tablet; Take 1 tablet (80 mg total) by mouth daily at 6 PM.  Dispense: 90 tablet; Refill: 1 - ticagrelor (BRILINTA) 90 MG TABS tablet; Take 1 tablet (90 mg total) by mouth 2 (two) times daily.  Dispense: 180 tablet; Refill: 2  2. Essential hypertension No medication changes warranted at the present time.   - atorvastatin (LIPITOR) 80 MG tablet; Take 1 tablet (80 mg total) by mouth daily at 6 PM.  Dispense: 90 tablet; Refill: 1 - carvedilol (COREG) 6.25 MG tablet; Take 1 tablet (6.25 mg total) by mouth 2 (two) times daily with a meal.  Dispense: 180 tablet; Refill: 1 - ticagrelor (BRILINTA) 90 MG TABS tablet; Take 1 tablet  (90 mg total) by mouth 2 (two) times daily.  Dispense: 180 tablet; Refill: 2  Follow Up Instructions:   Instructed patient to use [place] HBP, plain mucinex and otc antihistamine. Increase fluids and rest. Discussed calling the office if he develops a fever, chills or night sweats. If shortness of breath occurs, seek immediate attention.   We discussed hand washing, using hand sanitizer when soap and water are not available, only going out when absolutely necessary, and social distancing. Explained to patient that he is immunocompromised and will need to take precautions during this time.    I discussed the assessment and treatment plan with the patient. The patient was provided an opportunity to ask questions and all were answered. The patient agreed with the plan and demonstrated an understanding of the instructions.   The patient was advised to call back or seek an in-person evaluation if the symptoms worsen or if the condition fails to improve as anticipated.  I provided 15 minutes of non-face-to-face time during this encounter.  Ms. Andr L. Riley Lam, FNP-BC [facility] [address] [place], [place] [address] [phone]

## 2018-10-13 MED FILL — CARVEDILOL 6.25 MG TABLET: 6.25 | 30 days supply | Qty: 60 | Fill #5

## 2018-10-13 MED FILL — ATORVASTATIN 80 MG TABLET: 80 | 30 days supply | Qty: 30 | Fill #3

## 2018-10-28 ENCOUNTER — Ambulatory Visit (HOSPITAL_COMMUNITY): Payer: Medicaid Other | Attending: Cardiology

## 2018-10-31 ENCOUNTER — Telehealth: Payer: Self-pay | Admitting: Cardiology

## 2018-10-31 NOTE — Telephone Encounter (Signed)
LVM to schedule. ST °

## 2018-11-14 ENCOUNTER — Other Ambulatory Visit: Payer: Self-pay | Admitting: Family Medicine

## 2018-11-14 DIAGNOSIS — I10 Essential (primary) hypertension: Secondary | ICD-10-CM

## 2018-11-14 MED FILL — ?CARVEDILOL 6.25 MG TABLET: 6.25 | 30 days supply | Qty: 60 | Fill #0

## 2018-11-28 MED FILL — ATORVASTATIN 80 MG TABLET: 80 | 30 days supply | Qty: 30 | Fill #4

## 2018-12-02 ENCOUNTER — Ambulatory Visit (HOSPITAL_COMMUNITY)
Admission: RE | Admit: 2018-12-02 | Discharge: 2018-12-02 | Disposition: A | Discharge status: Home / Self Care | Payer: Self-pay | Source: Ambulatory Visit | Attending: Physician Assistant | Admitting: Physician Assistant

## 2018-12-02 ENCOUNTER — Other Ambulatory Visit: Payer: Self-pay

## 2018-12-02 DIAGNOSIS — Z87891 Personal history of nicotine dependence: Secondary | ICD-10-CM | POA: Insufficient documentation

## 2018-12-02 DIAGNOSIS — I340 Nonrheumatic mitral (valve) insufficiency: Secondary | ICD-10-CM | POA: Insufficient documentation

## 2018-12-02 DIAGNOSIS — R9431 Abnormal electrocardiogram [ECG] [EKG]: Secondary | ICD-10-CM | POA: Insufficient documentation

## 2018-12-02 DIAGNOSIS — E785 Hyperlipidemia, unspecified: Secondary | ICD-10-CM | POA: Insufficient documentation

## 2018-12-02 NOTE — Progress Notes (Signed)
°  Echocardiogram 2D Echocardiogram has been performed.  Adine Madura 12/02/2018, 2:36 PM

## 2018-12-16 MED FILL — ?CARVEDILOL 6.25 MG TABLET: 6.25 | 30 days supply | Qty: 60 | Fill #1

## 2018-12-26 MED FILL — ATORVASTATIN 80 MG TABLET: 80 | 30 days supply | Qty: 30 | Fill #5

## 2018-12-26 MED FILL — $BRILINTA 90 MG TABLET: 90 | 60 days supply | Qty: 120 | Fill #5

## 2019-01-20 ENCOUNTER — Other Ambulatory Visit: Payer: Self-pay | Admitting: Family Medicine

## 2019-01-20 DIAGNOSIS — I10 Essential (primary) hypertension: Secondary | ICD-10-CM

## 2019-01-20 DIAGNOSIS — I2111 ST elevation (STEMI) myocardial infarction involving right coronary artery: Secondary | ICD-10-CM

## 2019-01-20 MED FILL — ?CARVEDILOL 6.25 MG TABLET: 6.25 | 30 days supply | Qty: 60 | Fill #2

## 2019-01-20 MED FILL — ATORVASTATIN 80 MG TABLET: 80 | 30 days supply | Qty: 30 | Fill #0

## 2019-02-23 MED FILL — ATORVASTATIN 80 MG TABLET: 80 | 30 days supply | Qty: 30 | Fill #1

## 2019-02-23 MED FILL — ?CARVEDILOL 6.25 MG TABLET: 6.25 | 30 days supply | Qty: 60 | Fill #3

## 2019-02-26 ENCOUNTER — Telehealth: Payer: Self-pay

## 2019-02-26 NOTE — Telephone Encounter (Signed)
Called and spoke with patient's wife. He has already called the pharmacy and they had a refill for him. Thanks!

## 2019-03-20 NOTE — Progress Notes (Signed)
Cardiology Office Note:    Date:  03/26/2019   ID:  Robert Benson, DOB 1961-06-21, MRN [id number]  PCP:  Mike Gipouglas, Andre, FNP  Cardiologist:  Shirin Echeverry SwazilandJordan, MD   Referring MD: Mike Gipouglas, Andre, FNP   Chief Complaint  Patient presents with  . Other    6 month follow up. Patient denies chest pain and SOB at this time. Meds reviewed verbally with patient.   . Coronary Artery Disease    History of Present Illness:    Robert Benson is a 58 y.o. male seen for follow up CAD. He presented in September 2019 with an acute inferior STEMI. He was taken emergently to the Cath Lab.  Heart cath revealed RCA with a heavy thrombus burden treated with aspiration thrombectomy and DES.  LV gram with inferior wall motion abnormality and LVEF of 45-50%, confirmed with echocardiogram.  Grade 1 diastolic dysfunction.  He was discharged on dual antiplatelet therapy with aspirin and Brilinta for at least one year.  He was also discharged on statin and beta-blocker.  Post intervention, he had nonsustained ventricular tachycardia.  He was discharged on 04/01/2018.  On follow up today he is doing well. He has not resumed smoking since his heart attack. He has gained some weight.  He denies chest pain, SOB, DOE, orthopnea, and lower extremity swelling. He is compliant on medications. Knows he needs to do better with diet. Eating a lot of eggs and Oreos.    Past Medical History:  Diagnosis Date  . Arthritis   . Hyperlipidemia   . Hypertension   . STEMI (ST elevation myocardial infarction) [facility](HCC)     Past Surgical History:  Procedure Laterality Date  . CORONARY/GRAFT ACUTE MI REVASCULARIZATION N/A 03/29/2018   Procedure: Coronary/Graft Acute MI Revascularization;  Surgeon: SwazilandJordan, Marquia Costello M, MD;  [facility];  Service: Cardiovascular;  Laterality: N/A;  . HARDWARE REMOVAL Right 11/03/2014   Procedure: HARDWARE REMOVAL from middle finger;  Surgeon: Bradly BienenstockFred Ortmann, MD;  [facility];  Service: Orthopedics;   Laterality: Right;  . I&D EXTREMITY Right 08/23/2014   Procedure: EXPLORATION RIGHT HAND, THUMB INDEX, LONG AND RING FINGER, Index finger amputation, IP Fusion of Long Finger, ORIF of Thumb;  Surgeon: Bradly BienenstockFred Ortmann, MD;  Location: [facility] OR;  Service: Orthopedics;  Laterality: Right;  . LEFT HEART CATH AND CORONARY ANGIOGRAPHY N/A 03/29/2018   Procedure: LEFT HEART CATH AND CORONARY ANGIOGRAPHY;  Surgeon: SwazilandJordan, Alayzia Pavlock M, MD;  [facility];  Service: Cardiovascular;  Laterality: N/A;  . NERVE, TENDON AND ARTERY REPAIR Right 08/23/2014   Procedure: NERVE, TENDON, BONE AND ARTERY REPAIR AS NECESSARY;  Surgeon: Bradly BienenstockFred Ortmann, MD;  Location: [facility] OR;  Service: Orthopedics;  Laterality: Right;  . OPEN REDUCTION INTERNAL FIXATION (ORIF) HAND Right 08/23/2014   Procedure: OPEN REDUCTION INTERNAL FIXATION (ORIF) HAND;  Surgeon: Bradly BienenstockFred Ortmann, MD;  Location: [facility] OR;  Service: Orthopedics;  Laterality: Right;    Current Medications: Current Meds  Medication Sig  . aspirin 81 MG EC tablet Take 1 tablet (81 mg total) by mouth daily.  Marland Kitchen. atorvastatin (LIPITOR) 80 MG tablet TAKE 1 TABLET BY MOTUH DAILY AT 6 PM.  . carvedilol (COREG) 6.25 MG tablet TAKE 1 TABLET BY MOUTH TWICE DAILY WITH A MEAL.  . nitroGLYCERIN (NITROSTAT) 0.4 MG SL tablet Place 1 tablet (0.4 mg total) under the tongue every 5 (five) minutes x 3 doses as needed for chest pain.  . [DISCONTINUED] ticagrelor (BRILINTA) 90 MG TABS tablet Take  1 tablet (90 mg total) by mouth 2 (two) times daily.     Allergies:   Morphine and related and Percocet [oxycodone-acetaminophen]   Social History   Socioeconomic History  . Marital status: Married    Spouse name: Not on file  . Number of children: Not on file  . Years of education: Not on file  . Highest education level: Not on file  Occupational History  . Occupation: roofer/construction  Social Needs  . Financial resource strain: Not on file  . Food insecurity    Worry: Not on file     Inability: Not on file  . Transportation needs    Medical: Not on file    Non-medical: Not on file  Tobacco Use  . Smoking status: Former Smoker    Packs/day: 2.50    Years: 38.00    Pack years: 95.00  . Smokeless tobacco: Never Used  Substance and Sexual Activity  . Alcohol use: No  . Drug use: Yes    Types: Marijuana    Comment: 11/02/14- last time" this am"  . Sexual activity: Not on file  Lifestyle  . Physical activity    Days per week: Not on file    Minutes per session: Not on file  . Stress: Not on file  Relationships  . Social Musician on phone: Not on file    Gets together: Not on file    Attends religious service: Not on file    Active member of club or organization: Not on file    Attends meetings of clubs or organizations: Not on file    Relationship status: Not on file  Other Topics Concern  . Not on file  Social History Narrative  . Not on file     Family History: The patient's family history includes Hypertension in his father.  ROS:   Please see the history of present illness.    All other systems reviewed and are negative.  EKGs/Labs/Other Studies Reviewed:    The following studies were reviewed today:  Cath: 03/29/18   Prox RCA lesion is 99% stenosed.  Post intervention, there is a 0% residual stenosis.  A drug-eluting stent was successfully placed using a STENT SYNERGY DES 3.5X16.  There is mild left ventricular systolic dysfunction.  LV end diastolic pressure is normal.  The left ventricular ejection fraction is 45-50% by visual estimate.  1. Single vessel occlusive CAD involving the proximal RCA with large thrombus burden. 2. Mild LV dysfunction with inferior wall motion abnormality 3. Normal LVEDP 4. Successful PCI of the RCA with aspiration thrombectomy and stenting with DES x 1.  Plan: DAPT for one year. IV Aggrastat for 18 hours. High dose statin. May be a candidate for fast track DC if no complications.    Recommend uninterrupted dual antiplatelet therapy with Aspirin 81mg  daily and Ticagrelor 90mg  twice daily for a minimum of 12 months (ACS - Class I recommendation).   TTE: 03/31/18 Study Conclusions - Left ventricle: The cavity size was normal. Systolic function was mildly reduced. The estimated ejection fraction was in the range of 45% to 50%. There is akinesis of the basal-midinferoseptal myocardium. Doppler parameters are consistent with abnormal left ventricular relaxation (grade 1 diastolic dysfunction). - Mitral valve: There was moderate regurgitation  Echo 12/02/18: IMPRESSIONS    1. The left ventricle has mildly reduced systolic function, with an ejection fraction of 45-50%. The cavity size was normal. Left ventricular diastolic parameters were normal.  2. The right  ventricle has normal systolic function. The cavity was normal.  3. The mitral valve is grossly normal.  4. The tricuspid valve is grossly normal.  5. The aortic valve is tricuspid. Mild thickening of the aortic valve. No stenosis of the aortic valve.  6. Severe hypokinesis of the basal inferior and inferolateral walls; overall mild LV dysfunction; mild MR.   EKG:  EKG is ordered today.  The ekg ordered today demonstrates sinus with old inferior infarct. I have personally reviewed and interpreted this study.   Recent Labs: 03/29/2018: Magnesium 2.0; TSH 0.541 04/01/2018: Hemoglobin 14.2; Platelets 200 04/09/2018: ALT 19; BUN 16; Creatinine, Ser 0.96; Potassium 4.1; Sodium 137  Recent Lipid Panel    Component Value Date/Time   CHOL 111 03/30/2018 0233   TRIG 52 03/30/2018 0233   HDL 33 (L) 03/30/2018 0233   CHOLHDL 3.4 03/30/2018 0233   VLDL 10 03/30/2018 0233   LDLCALC 68 03/30/2018 0233    Physical Exam:    VS:  BP 106/62 (BP Location: Right Arm, Patient Position: Sitting, Cuff Size: Normal)   Pulse 62   Ht 5\' 6"  (1.676 m)   Wt 151 lb 8 oz (68.7 kg)   BMI 24.45 kg/m     Wt Readings from  Last 3 Encounters:  03/26/19 151 lb 8 oz (68.7 kg)  04/09/18 134 lb 3.2 oz (60.9 kg)  04/08/18 134 lb 12.8 oz (61.1 kg)     GEN: Well nourished, well developed in no acute distress HEENT: Normal NECK: No JVD; No carotid bruits CARDIAC: RRR, no murmurs, rubs, gallops RESPIRATORY:  Clear to auscultation without rales, wheezing or rhonchi  ABDOMEN: Soft, non-tender, non-distended MUSCULOSKELETAL:  No edema; No deformity right radial C/D/I SKIN: Warm and dry NEUROLOGIC:  Alert and oriented x 3 PSYCHIATRIC:  Normal affect   ASSESSMENT:    1. Coronary artery disease involving native coronary artery of native heart without angina pectoris   2. Essential hypertension   3. Hypercholesterolemia    PLAN:    In order of problems listed above:  1. Coronary artery disease involving native coronary artery of native heart without angina pectoris  STEMI s/p thrombectomy and DES to RCA- September 14,2019  He is asymptomatic. Continue ASA, statin, and Coreg. May stop Brilinta at this time.   2. Essential Hypertension Well controlled on Coreg.    3. Hyperlipidemia, unspecified hyperlipidemia type - on high dose statin. Will check lab work today.   4. Tobacco abuse He has quit smoking cigarettes one year ago.     Medication Adjustments/Labs and Tests Ordered: Current medicines are reviewed at length with the patient today.  Concerns regarding medicines are outlined above.  No orders of the defined types were placed in this encounter.  No orders of the defined types were placed in this encounter.   Signed, Miles Borkowski Martinique, MD  03/26/2019 3:55 PM    [place]

## 2019-03-25 ENCOUNTER — Encounter (HOSPITAL_COMMUNITY): Payer: Self-pay

## 2019-03-25 ENCOUNTER — Encounter (HOSPITAL_COMMUNITY): Payer: Self-pay | Admitting: *Deleted

## 2019-03-26 ENCOUNTER — Encounter: Payer: Self-pay | Admitting: Cardiology

## 2019-03-26 ENCOUNTER — Ambulatory Visit (INDEPENDENT_AMBULATORY_CARE_PROVIDER_SITE_OTHER): Payer: Self-pay | Admitting: Cardiology

## 2019-03-26 ENCOUNTER — Other Ambulatory Visit: Payer: Self-pay

## 2019-03-26 VITALS — BP 106/62 | HR 62 | Ht 66.0 in | Wt 151.5 lb

## 2019-03-26 DIAGNOSIS — E7800 Pure hypercholesterolemia, unspecified: Secondary | ICD-10-CM

## 2019-03-26 DIAGNOSIS — I2510 Atherosclerotic heart disease of native coronary artery without angina pectoris: Secondary | ICD-10-CM

## 2019-03-26 DIAGNOSIS — I10 Essential (primary) hypertension: Secondary | ICD-10-CM

## 2019-03-26 NOTE — Patient Instructions (Signed)
Stop taking Brilinta  Continue your other therapy  We will check lab work today

## 2019-03-26 NOTE — Addendum Note (Signed)
Addended by: Kathyrn Lass on: 03/26/2019 04:01 PM   Modules accepted: Orders

## 2019-03-27 LAB — HEPATIC FUNCTION PANEL
ALT: 15 IU/L (ref 0–44)
AST: 13 IU/L (ref 0–40)
Albumin: 3.9 g/dL (ref 3.8–4.9)
Alkaline Phosphatase: 84 IU/L (ref 39–117)
Bilirubin Total: 0.3 mg/dL (ref 0.0–1.2)
Bilirubin, Direct: 0.09 mg/dL (ref 0.00–0.40)
Total Protein: 5.9 g/dL — ABNORMAL LOW (ref 6.0–8.5)

## 2019-03-27 LAB — LIPID PANEL
Chol/HDL Ratio: 2.7 ratio (ref 0.0–5.0)
Cholesterol, Total: 101 mg/dL (ref 100–199)
HDL: 38 mg/dL — ABNORMAL LOW (ref >39)
LDL Chol Calc (NIH): 49 mg/dL (ref 0–99)
Triglycerides: 60 mg/dL (ref 0–149)
VLDL Cholesterol Cal: 14 mg/dL (ref 5–40)

## 2019-03-27 LAB — BASIC METABOLIC PANEL
BUN/Creatinine Ratio: 15 (ref 9–20)
BUN: 12 mg/dL (ref 6–24)
CO2: 22 mmol/L (ref 20–29)
Calcium: 8.6 mg/dL — ABNORMAL LOW (ref 8.7–10.2)
Chloride: 107 mmol/L — ABNORMAL HIGH (ref 96–106)
Creatinine, Ser: 0.79 mg/dL (ref 0.76–1.27)
GFR calc Af Amer: 116 mL/min/{1.73_m2} (ref >59)
GFR calc non Af Amer: 100 mL/min/{1.73_m2} (ref >59)
Glucose: 108 mg/dL — ABNORMAL HIGH (ref 65–99)
Potassium: 3.5 mmol/L (ref 3.5–5.2)
Sodium: 141 mmol/L (ref 134–144)

## 2019-03-27 MED FILL — ?CARVEDILOL 6.25 MG TABLET: 6.25 | 30 days supply | Qty: 60 | Fill #4

## 2019-04-10 MED FILL — ATORVASTATIN 80 MG TABLET: 80 | 30 days supply | Qty: 30 | Fill #2

## 2019-04-28 MED FILL — ?CARVEDILOL 6.25 MG TABLET: 6.25 | 30 days supply | Qty: 60 | Fill #5

## 2019-05-14 ENCOUNTER — Telehealth: Payer: Self-pay | Admitting: Cardiology

## 2019-05-14 MED FILL — ATORVASTATIN 80 MG TABLET: 80 | 30 days supply | Qty: 30 | Fill #3

## 2019-05-14 NOTE — Telephone Encounter (Signed)
Patient's wife wants to know what medication they took the patient off of on his llast visit with Dr. Martinique, please advise.

## 2019-05-14 NOTE — Telephone Encounter (Signed)
Returned the call to the patient since the wife is not listed on the dpr. He wanted to make sure he was supposed to be off of the [place]. He has been advised that this was correct.

## 2019-06-01 ENCOUNTER — Other Ambulatory Visit: Payer: Self-pay | Admitting: Family Medicine

## 2019-06-01 DIAGNOSIS — I10 Essential (primary) hypertension: Secondary | ICD-10-CM

## 2019-06-01 MED FILL — ?CARVEDILOL 6.25 MG TABLET: 6.25 | 30 days supply | Qty: 60 | Fill #0

## 2019-06-19 MED FILL — ATORVASTATIN 80 MG TABLET: 80 | 30 days supply | Qty: 30 | Fill #4

## 2019-07-07 MED FILL — ?CARVEDILOL 6.25 MG TABLET: 6.25 | 30 days supply | Qty: 60 | Fill #1

## 2019-07-28 MED FILL — ATORVASTATIN 80 MG TABLET: 80 | 30 days supply | Qty: 30 | Fill #5

## 2019-08-12 ENCOUNTER — Other Ambulatory Visit: Payer: Self-pay | Admitting: Internal Medicine

## 2019-08-12 DIAGNOSIS — I10 Essential (primary) hypertension: Secondary | ICD-10-CM

## 2019-08-13 ENCOUNTER — Other Ambulatory Visit: Payer: Self-pay | Admitting: Nurse Practitioner

## 2019-08-13 DIAGNOSIS — I10 Essential (primary) hypertension: Secondary | ICD-10-CM

## 2019-08-13 MED ORDER — CARVEDILOL 6.25 MG PO TABS: 6.2500 mg | ORAL_TABLET | Freq: Two times a day (BID) | ORAL | 0 refills | Status: DC

## 2019-08-13 MED FILL — ?CARVEDILOL 6.25 MG TABLET: 6.25 | 7 days supply | Qty: 14 | Fill #0

## 2019-08-20 NOTE — Telephone Encounter (Signed)
I am no longer this patient's PCP. Please send to Crystal King, NP. Thanks

## 2019-08-27 ENCOUNTER — Other Ambulatory Visit: Payer: Self-pay | Admitting: Family Medicine

## 2019-08-27 DIAGNOSIS — I10 Essential (primary) hypertension: Secondary | ICD-10-CM

## 2019-08-27 DIAGNOSIS — I2111 ST elevation (STEMI) myocardial infarction involving right coronary artery: Secondary | ICD-10-CM

## 2019-08-27 MED FILL — ?CARVEDILOL 6.25 MG TABLET: 6.25 | 30 days supply | Qty: 60 | Fill #0

## 2019-08-27 MED FILL — ?ATORVASTATIN 40MG TABL: 40 | 30 days supply | Qty: 60 | Fill #0

## 2019-08-28 ENCOUNTER — Ambulatory Visit (INDEPENDENT_AMBULATORY_CARE_PROVIDER_SITE_OTHER): Payer: Self-pay | Admitting: Nurse Practitioner

## 2019-08-28 ENCOUNTER — Other Ambulatory Visit: Payer: Self-pay

## 2019-08-28 ENCOUNTER — Encounter: Payer: Self-pay | Admitting: Nurse Practitioner

## 2019-08-28 VITALS — BP 131/82 | HR 67 | Temp 98.1°F | Resp 14 | Ht 66.0 in | Wt 159.0 lb

## 2019-08-28 DIAGNOSIS — I2111 ST elevation (STEMI) myocardial infarction involving right coronary artery: Secondary | ICD-10-CM

## 2019-08-28 DIAGNOSIS — I10 Essential (primary) hypertension: Secondary | ICD-10-CM

## 2019-08-28 DIAGNOSIS — Z0000 Encounter for general adult medical examination without abnormal findings: Secondary | ICD-10-CM

## 2019-08-28 LAB — POCT URINALYSIS DIPSTICK
Bilirubin, UA: NEGATIVE
Blood, UA: NEGATIVE
Glucose, UA: NEGATIVE
Ketones, UA: NEGATIVE
Nitrite, UA: NEGATIVE
Protein, UA: NEGATIVE
Spec Grav, UA: 1.01 (ref 1.010–1.025)
Urobilinogen, UA: 1 E.U./dL
pH, UA: 6 (ref 5.0–8.0)

## 2019-08-28 MED ORDER — ATORVASTATIN CALCIUM 80 MG PO TABS: ORAL_TABLET | ORAL | 1 refills | Status: DC

## 2019-08-28 MED ORDER — CARVEDILOL 6.25 MG PO TABS: ORAL_TABLET | ORAL | 1 refills | Status: DC

## 2019-08-28 NOTE — Progress Notes (Signed)
Established Patient Office Visit  Subjective:  Patient ID: Robert Benson, male    DOB: 10-17-1960  Age: 59 y.o. MRN: [id number]  CC:  Chief Complaint  Patient presents with  . Hypertension  . Medication Refill    all meds     HPI Robert Benson presents for follow-up.  He has a history of coronary artery disease, post STEMI in 2019 with revascularization, arthritis, hyperlipidemia, hypertension.  He is currently on carvedilol 6.25 mg twice daily and atrial fast and 80 mg nightly.  Denies any side effects of his medication.  He admits that he is walking approximately 2 miles per day.  He was unable to walk this morning secondary to his appointment and the weather.  He denies diaphoresis, chest pain, dyspnea on exertion, nausea or vomiting, pain into the neck, shoulder, arm or back.  He admits that he has not had to take any nitroglycerin since this last office visit.   Past Medical History:  Diagnosis Date  . Arthritis   . Hyperlipidemia   . Hypertension   . STEMI (ST elevation myocardial infarction) [facility])     Past Surgical History:  Procedure Laterality Date  . CORONARY/GRAFT ACUTE MI REVASCULARIZATION N/A 03/29/2018   Procedure: Coronary/Graft Acute MI Revascularization;  Surgeon: Benson, Peter M, MD;  Location: [place] CV LAB;  Service: Cardiovascular;  Laterality: N/A;  . HARDWARE REMOVAL Right 11/03/2014   Procedure: HARDWARE REMOVAL from middle finger;  Surgeon: Iran Planas, MD;  Location: [place];  Service: Orthopedics;  Laterality: Right;  . I & D EXTREMITY Right 08/23/2014   Procedure: EXPLORATION RIGHT HAND, THUMB INDEX, LONG AND RING FINGER, Index finger amputation, IP Fusion of Long Finger, ORIF of Thumb;  Surgeon: Iran Planas, MD;  Location: [place];  Service: Orthopedics;  Laterality: Right;  . LEFT HEART CATH AND CORONARY ANGIOGRAPHY N/A 03/29/2018   Procedure: LEFT HEART CATH AND CORONARY ANGIOGRAPHY;  Surgeon: Benson, Peter M, MD;  Location: [place] CV LAB;   Service: Cardiovascular;  Laterality: N/A;  . NERVE, TENDON AND ARTERY REPAIR Right 08/23/2014   Procedure: NERVE, TENDON, BONE AND ARTERY REPAIR AS NECESSARY;  Surgeon: Iran Planas, MD;  Location: [place];  Service: Orthopedics;  Laterality: Right;  . OPEN REDUCTION INTERNAL FIXATION (ORIF) HAND Right 08/23/2014   Procedure: OPEN REDUCTION INTERNAL FIXATION (ORIF) HAND;  Surgeon: Iran Planas, MD;  Location: [place];  Service: Orthopedics;  Laterality: Right;    Family History  Problem Relation Age of Onset  . Hypertension Father     Social History   Socioeconomic History  . Marital status: Married    Spouse name: Not on file  . Number of children: Not on file  . Years of education: Not on file  . Highest education level: Not on file  Occupational History  . Occupation: roofer/construction  Tobacco Use  . Smoking status: Former Smoker    Packs/day: 2.50    Years: 38.00    Pack years: 95.00  . Smokeless tobacco: Never Used  Substance and Sexual Activity  . Alcohol use: No  . Drug use: Yes    Types: Marijuana    Comment: 11/02/14- last time" this am"  . Sexual activity: Not on file  Other Topics Concern  . Not on file  Social History Narrative  . Not on file   Social Determinants of Health   Financial Resource Strain:   . Difficulty of Paying Living Expenses: Not on file  Food Insecurity:   .  Worried About Programme researcher, broadcasting/film/video in the Last Year: Not on file  . Ran Out of Food in the Last Year: Not on file  Transportation Needs:   . Lack of Transportation (Medical): Not on file  . Lack of Transportation (Non-Medical): Not on file  Physical Activity:   . Days of Exercise per Week: Not on file  . Minutes of Exercise per Session: Not on file  Stress:   . Feeling of Stress : Not on file  Social Connections:   . Frequency of Communication with Friends and Family: Not on file  . Frequency of Social Gatherings with Friends and Family: Not on file  . Attends Religious Services:  Not on file  . Active Member of Clubs or Organizations: Not on file  . Attends Banker Meetings: Not on file  . Marital Status: Not on file  Intimate Partner Violence:   . Fear of Current or Ex-Partner: Not on file  . Emotionally Abused: Not on file  . Physically Abused: Not on file  . Sexually Abused: Not on file    Outpatient Medications Prior to Visit  Medication Sig Dispense Refill  . aspirin 81 MG EC tablet Take 1 tablet (81 mg total) by mouth daily. 30 tablet   . nitroGLYCERIN (NITROSTAT) 0.4 MG SL tablet Place 1 tablet (0.4 mg total) under the tongue every 5 (five) minutes x 3 doses as needed for chest pain. 25 tablet 2  . atorvastatin (LIPITOR) 80 MG tablet TAKE 1 TABLET BY MOTUH DAILY AT 6 PM. 30 tablet 0  . carvedilol (COREG) 6.25 MG tablet TAKE 1 TABLET BY MOUTH TWICE DAILY WITH A MEAL. 60 tablet 0   No facility-administered medications prior to visit.    Allergies  Allergen Reactions  . Morphine And Related Nausea And Vomiting  . Percocet [Oxycodone-Acetaminophen] Itching and Nausea And Vomiting    ROS Review of Systems  Constitutional: Negative.   HENT: Negative.   Eyes: Negative.   Respiratory: Negative.   Cardiovascular: Negative.   Gastrointestinal: Negative.   Endocrine: Negative.   Genitourinary: Negative.   Musculoskeletal: Negative.   Skin: Negative.   Allergic/Immunologic: Negative.   Neurological: Negative.   Hematological: Negative.   Psychiatric/Behavioral: Negative.       Objective:    Physical Exam  Constitutional: He is oriented to person, place, and time. He appears well-developed.  HENT:  Head: Normocephalic.  Cardiovascular: Normal rate, regular rhythm and normal heart sounds.  Pulmonary/Chest: Effort normal and breath sounds normal.  Abdominal: Soft.  hypoactive  Musculoskeletal:        General: Normal range of motion.     Cervical back: Normal range of motion and neck supple.  Neurological: He is alert and  oriented to person, place, and time.  Skin: Skin is warm and dry.  Psychiatric: He has a normal mood and affect. His behavior is normal. Judgment and thought content normal.    BP 131/82 (BP Location: Left Arm, Patient Position: Sitting, Cuff Size: Normal)   Pulse 67   Temp 98.1 F (36.7 C) (Oral)   Resp 14   Ht 5\' 6"  (1.676 m)   Wt 159 lb (72.1 kg)   SpO2 98%   BMI 25.66 kg/m  Wt Readings from Last 3 Encounters:  08/28/19 159 lb (72.1 kg)  03/26/19 151 lb 8 oz (68.7 kg)  04/09/18 134 lb 3.2 oz (60.9 kg)     Health Maintenance Due  Topic Date Due  . COLONOSCOPY  07/09/2012  There are no preventive care reminders to display for this patient.  Lab Results  Component Value Date   TSH 0.541 03/29/2018   Lab Results  Component Value Date   WBC 10.9 (H) 04/01/2018   HGB 14.2 04/01/2018   HCT 43.5 04/01/2018   MCV 93.1 04/01/2018   PLT 200 04/01/2018   Lab Results  Component Value Date   NA 141 03/26/2019   K 3.5 03/26/2019   CO2 22 03/26/2019   GLUCOSE 108 (H) 03/26/2019   BUN 12 03/26/2019   CREATININE 0.79 03/26/2019   BILITOT 0.3 03/26/2019   ALKPHOS 84 03/26/2019   AST 13 03/26/2019   ALT 15 03/26/2019   PROT 5.9 (L) 03/26/2019   ALBUMIN 3.9 03/26/2019   CALCIUM 8.6 (L) 03/26/2019   ANIONGAP 14 04/01/2018   Lab Results  Component Value Date   CHOL 101 03/26/2019   Lab Results  Component Value Date   HDL 38 (L) 03/26/2019   Lab Results  Component Value Date   LDLCALC 49 03/26/2019   Lab Results  Component Value Date   TRIG 60 03/26/2019   Lab Results  Component Value Date   CHOLHDL 2.7 03/26/2019   Lab Results  Component Value Date   HGBA1C 5.7 (H) 03/29/2018      Assessment & Plan:   Problem List Items Addressed This Visit      Unprioritized   Hypertension - Primary   Relevant Medications   atorvastatin (LIPITOR) 80 MG tablet   carvedilol (COREG) 6.25 MG tablet   Other Relevant Orders   Urinalysis Dipstick (Completed)    STEMI involving right coronary artery (HCC)   Relevant Medications   atorvastatin (LIPITOR) 80 MG tablet   carvedilol (COREG) 6.25 MG tablet   Other Relevant Orders   Comp. Metabolic Panel (12)   Lipid Panel    Other Visit Diagnoses    Healthcare maintenance       Labs pending Referral to gastroenterology    Relevant Orders   Ambulatory referral to Gastroenterology   PSA      Meds ordered this encounter  Medications  . atorvastatin (LIPITOR) 80 MG tablet    Sig: TAKE 1 TABLET BY MOTUH DAILY AT 6 PM.    Dispense:  90 tablet    Refill:  1    Order Specific Question:   Supervising Provider    Answer:   Quentin Angst [id number]  . carvedilol (COREG) 6.25 MG tablet    Sig: TAKE 1 TABLET BY MOUTH TWICE DAILY WITH A MEAL.    Dispense:  180 tablet    Refill:  1    Order Specific Question:   Supervising Provider    Answer:   Quentin Angst [id number]    Follow-up: Return in about 6 months (around 02/25/2020).    Barbette Merino, NP

## 2019-08-28 NOTE — Patient Instructions (Signed)
Healthy Eating Following a healthy eating pattern may help you to achieve and maintain a healthy body weight, reduce the risk of chronic disease, and live a long and productive life. It is important to follow a healthy eating pattern at an appropriate calorie level for your body. Your nutritional needs should be met primarily through food by choosing a variety of nutrient-rich foods. What are tips for following this plan? Reading food labels  Read labels and choose the following: ? Reduced or low sodium. ? Juices with 100% fruit juice. ? Foods with low saturated fats and high polyunsaturated and monounsaturated fats. ? Foods with whole grains, such as whole wheat, cracked wheat, brown rice, and wild rice. ? Whole grains that are fortified with folic acid. This is recommended for women who are pregnant or who want to become pregnant.  Read labels and avoid the following: ? Foods with a lot of added sugars. These include foods that contain brown sugar, corn sweetener, corn syrup, dextrose, fructose, glucose, high-fructose corn syrup, honey, invert sugar, lactose, malt syrup, maltose, molasses, raw sugar, sucrose, trehalose, or turbinado sugar.  Do not eat more than the following amounts of added sugar per day:  6 teaspoons (25 g) for women.  9 teaspoons (38 g) for men. ? Foods that contain processed or refined starches and grains. ? Refined grain products, such as white flour, degermed cornmeal, white bread, and white rice. Shopping  Choose nutrient-rich snacks, such as vegetables, whole fruits, and nuts. Avoid high-calorie and high-sugar snacks, such as potato chips, fruit snacks, and candy.  Use oil-based dressings and spreads on foods instead of solid fats such as butter, stick margarine, or cream cheese.  Limit pre-made sauces, mixes, and "instant" products such as flavored rice, instant noodles, and ready-made pasta.  Try more plant-protein sources, such as tofu, tempeh, black beans,  edamame, lentils, nuts, and seeds.  Explore eating plans such as the Mediterranean diet or vegetarian diet. Cooking  Use oil to saut or stir-fry foods instead of solid fats such as butter, stick margarine, or lard.  Try baking, boiling, grilling, or broiling instead of frying.  Remove the fatty part of meats before cooking.  Steam vegetables in water or broth. Meal planning   At meals, imagine dividing your plate into fourths: ? One-half of your plate is fruits and vegetables. ? One-fourth of your plate is whole grains. ? One-fourth of your plate is protein, especially lean meats, poultry, eggs, tofu, beans, or nuts.  Include low-fat dairy as part of your daily diet. Lifestyle  Choose healthy options in all settings, including home, work, school, restaurants, or stores.  Prepare your food safely: ? Wash your hands after handling raw meats. ? Keep food preparation surfaces clean by regularly washing with hot, soapy water. ? Keep raw meats separate from ready-to-eat foods, such as fruits and vegetables. ? Cook seafood, meat, poultry, and eggs to the recommended internal temperature. ? Store foods at safe temperatures. In general:  Keep cold foods at 54F (4.4C) or below.  Keep hot foods at 154F (60C) or above.  Keep your freezer at [facility] (-17.8C) or below.  Foods are no longer safe to eat when they have been between the temperatures of 40-154F (4.4-60C) for more than 2 hours. What foods should I eat? Fruits Aim to eat 2 cup-equivalents of fresh, canned (in natural juice), or frozen fruits each day. Examples of 1 cup-equivalent of fruit include 1 small apple, 8 large strawberries, 1 cup canned fruit,  cup  dried fruit, or 1 cup 100% juice. Vegetables Aim to eat 2-3 cup-equivalents of fresh and frozen vegetables each day, including different varieties and colors. Examples of 1 cup-equivalent of vegetables include 2 medium carrots, 2 cups raw, leafy greens, 1 cup chopped  vegetable (raw or cooked), or 1 medium baked potato. Grains Aim to eat 6 ounce-equivalents of whole grains each day. Examples of 1 ounce-equivalent of grains include 1 slice of bread, 1 cup ready-to-eat cereal, 3 cups popcorn, or  cup cooked rice, pasta, or cereal. Meats and other proteins Aim to eat 5-6 ounce-equivalents of protein each day. Examples of 1 ounce-equivalent of protein include 1 egg, 1/2 cup nuts or seeds, or 1 tablespoon (16 g) peanut butter. A cut of meat or fish that is the size of a deck of cards is about 3-4 ounce-equivalents.  Of the protein you eat each week, try to have at least 8 ounces come from seafood. This includes salmon, trout, herring, and anchovies. Dairy Aim to eat 3 cup-equivalents of fat-free or low-fat dairy each day. Examples of 1 cup-equivalent of dairy include 1 cup (240 mL) milk, 8 ounces (250 g) yogurt, 1 ounces (44 g) natural cheese, or 1 cup (240 mL) fortified soy milk. Fats and oils  Aim for about 5 teaspoons (21 g) per day. Choose monounsaturated fats, such as canola and olive oils, avocados, peanut butter, and most nuts, or polyunsaturated fats, such as sunflower, corn, and soybean oils, walnuts, pine nuts, sesame seeds, sunflower seeds, and flaxseed. Beverages  Aim for six 8-oz glasses of water per day. Limit coffee to three to five 8-oz cups per day.  Limit caffeinated beverages that have added calories, such as soda and energy drinks.  Limit alcohol intake to no more than 1 drink a day for nonpregnant women and 2 drinks a day for men. One drink equals 12 oz of beer (355 mL), 5 oz of wine (148 mL), or 1 oz of hard liquor (44 mL). Seasoning and other foods  Avoid adding excess amounts of salt to your foods. Try flavoring foods with herbs and spices instead of salt.  Avoid adding sugar to foods.  Try using oil-based dressings, sauces, and spreads instead of solid fats. This information is based on general [place] nutrition guidelines. For more  information, visit [employer]. Exact amounts may vary based on your nutrition needs. Summary  A healthy eating plan may help you to maintain a healthy weight, reduce the risk of chronic diseases, and stay active throughout your life.  Plan your meals. Make sure you eat the right portions of a variety of nutrient-rich foods.  Try baking, boiling, grilling, or broiling instead of frying.  Choose healthy options in all settings, including home, work, school, restaurants, or stores. This information is not intended to replace advice given to you by your health care provider. Make sure you discuss any questions you have with your health care provider. Document Revised: 10/14/2017 Document Reviewed: 10/14/2017 Elsevier Patient Education  [place].

## 2019-09-01 ENCOUNTER — Other Ambulatory Visit: Payer: Self-pay

## 2019-09-01 ENCOUNTER — Other Ambulatory Visit: Payer: Medicaid Other

## 2019-09-01 ENCOUNTER — Ambulatory Visit (AMBULATORY_SURGERY_CENTER): Payer: Self-pay | Admitting: *Deleted

## 2019-09-01 VITALS — Temp 98.0°F | Ht 66.0 in | Wt 158.8 lb

## 2019-09-01 DIAGNOSIS — Z0000 Encounter for general adult medical examination without abnormal findings: Secondary | ICD-10-CM

## 2019-09-01 DIAGNOSIS — I2111 ST elevation (STEMI) myocardial infarction involving right coronary artery: Secondary | ICD-10-CM

## 2019-09-01 DIAGNOSIS — Z1211 Encounter for screening for malignant neoplasm of colon: Secondary | ICD-10-CM

## 2019-09-01 DIAGNOSIS — Z01818 Encounter for other preprocedural examination: Secondary | ICD-10-CM

## 2019-09-01 NOTE — Progress Notes (Signed)
Pt hasn't needed to take Nitro SL for "a long time."   No trouble with anesthesia, difficulty with intubation or hx/fam hx of malignant hypothermia  Pt is aware that care partner will wait in the car during procedure; if they feel like they will be too hot or cold to wait in the car; they may wait in the 4 th floor lobby. Patient is aware to bring only one care partner. We want them to wear a mask (we do not have any that we can provide them), practice social distancing, and we will check their temperatures when they get here.  I did remind the patient that their care partner needs to stay in the parking lot the entire time and have a cell phone available, we will call them when the pt is ready for discharge. Patient will wear mask into building.  covid test 09-07-19 at 11:50 am   No egg or soy allergy  No home oxygen use   No medications for weight loss taken  emmi information given

## 2019-09-02 LAB — COMP. METABOLIC PANEL (12)
AST: 18 IU/L (ref 0–40)
Albumin/Globulin Ratio: 1.7 (ref 1.2–2.2)
Albumin: 4.7 g/dL (ref 3.8–4.9)
Alkaline Phosphatase: 124 IU/L — ABNORMAL HIGH (ref 39–117)
BUN/Creatinine Ratio: 11 (ref 9–20)
BUN: 11 mg/dL (ref 6–24)
Bilirubin Total: 0.5 mg/dL (ref 0.0–1.2)
Calcium: 9.7 mg/dL (ref 8.7–10.2)
Chloride: 97 mmol/L (ref 96–106)
Creatinine, Ser: 1.03 mg/dL (ref 0.76–1.27)
GFR calc Af Amer: 93 mL/min/{1.73_m2} (ref >59)
GFR calc non Af Amer: 80 mL/min/{1.73_m2} (ref >59)
Globulin, Total: 2.7 g/dL (ref 1.5–4.5)
Glucose: 93 mg/dL (ref 65–99)
Potassium: 4.8 mmol/L (ref 3.5–5.2)
Sodium: 138 mmol/L (ref 134–144)
Total Protein: 7.4 g/dL (ref 6.0–8.5)

## 2019-09-02 LAB — LIPID PANEL
Chol/HDL Ratio: 2.7 ratio (ref 0.0–5.0)
Cholesterol, Total: 125 mg/dL (ref 100–199)
HDL: 46 mg/dL (ref >39)
LDL Chol Calc (NIH): 64 mg/dL (ref 0–99)
Triglycerides: 76 mg/dL (ref 0–149)
VLDL Cholesterol Cal: 15 mg/dL (ref 5–40)

## 2019-09-02 LAB — PSA: Prostate Specific Ag, Serum: 0.9 ng/mL (ref 0.0–4.0)

## 2019-09-07 ENCOUNTER — Ambulatory Visit (INDEPENDENT_AMBULATORY_CARE_PROVIDER_SITE_OTHER): Payer: Self-pay

## 2019-09-07 ENCOUNTER — Other Ambulatory Visit: Payer: Self-pay

## 2019-09-07 DIAGNOSIS — Z1159 Encounter for screening for other viral diseases: Secondary | ICD-10-CM

## 2019-09-08 LAB — SARS CORONAVIRUS 2 (TAT 6-24 HRS): SARS Coronavirus 2: NEGATIVE

## 2019-09-10 ENCOUNTER — Ambulatory Visit (AMBULATORY_SURGERY_CENTER): Payer: Self-pay | Admitting: Internal Medicine

## 2019-09-10 ENCOUNTER — Encounter: Payer: Self-pay | Admitting: Internal Medicine

## 2019-09-10 ENCOUNTER — Other Ambulatory Visit: Payer: Self-pay

## 2019-09-10 VITALS — BP 130/58 | HR 63 | Temp 96.9°F | Resp 18 | Ht 66.0 in | Wt 158.0 lb

## 2019-09-10 DIAGNOSIS — Z1211 Encounter for screening for malignant neoplasm of colon: Secondary | ICD-10-CM

## 2019-09-10 MED ORDER — SODIUM CHLORIDE 0.9 % IV SOLN: 500.0000 mL | Freq: Once | INTRAVENOUS | Status: DC

## 2019-09-10 NOTE — Patient Instructions (Addendum)
No polyps or cancer seen.  You do have a condition called diverticulosis - common and not usually a problem. Please read the handout provided.  Next routine colonoscopy or other screening test in 10 years - 2031  I appreciate the opportunity to care for you. Iva Boop, MD, [facility]   Handout on diverticulosis given.  YOU HAD AN ENDOSCOPIC PROCEDURE TODAY AT THE [facility]:   Refer to the procedure report that was given to you for any specific questions about what was found during the examination.  If the procedure report does not answer your questions, please call your gastroenterologist to clarify.  If you requested that your care partner not be given the details of your procedure findings, then the procedure report has been included in a sealed envelope for you to review at your convenience later.  YOU SHOULD EXPECT: Some feelings of bloating in the abdomen. Passage of more gas than usual.  Walking can help get rid of the air that was put into your GI tract during the procedure and reduce the bloating. If you had a lower endoscopy (such as a colonoscopy or flexible sigmoidoscopy) you may notice spotting of blood in your stool or on the toilet paper. If you underwent a bowel prep for your procedure, you may not have a normal bowel movement for a few days.  Please Note:  You might notice some irritation and congestion in your nose or some drainage.  This is from the oxygen used during your procedure.  There is no need for concern and it should clear up in a day or so.  SYMPTOMS TO REPORT IMMEDIATELY:   Following lower endoscopy (colonoscopy or flexible sigmoidoscopy):  Excessive amounts of blood in the stool  Significant tenderness or worsening of abdominal pains  Swelling of the abdomen that is new, acute  Fever of 100F or higher   For urgent or emergent issues, a gastroenterologist can be reached at any hour by calling [phone].   DIET:  We do recommend a small  meal at first, but then you may proceed to your regular diet.  Drink plenty of fluids but you should avoid alcoholic beverages for 24 hours.  ACTIVITY:  You should plan to take it easy for the rest of today and you should NOT DRIVE or use heavy machinery until tomorrow (because of the sedation medicines used during the test).    FOLLOW UP: Our staff will call the number listed on your records 48-72 hours following your procedure to check on you and address any questions or concerns that you may have regarding the information given to you following your procedure. If we do not reach you, we will leave a message.  We will attempt to reach you two times.  During this call, we will ask if you have developed any symptoms of COVID 19. If you develop any symptoms (ie: fever, flu-like symptoms, shortness of breath, cough etc.) before then, please call [phone].  If you test positive for Covid 19 in the 2 weeks post procedure, please call and report this information to [place].    If any biopsies were taken you will be contacted by phone or by letter within the next 1-3 weeks.  Please call us at [phone] if you have not heard about the biopsies in 3 weeks.    SIGNATURES/CONFIDENTIALITY: You and/or your care partner have signed paperwork which will be entered into your electronic medical record.  These signatures attest to the fact  that that the information above on your After Visit Summary has been reviewed and is understood.  Full responsibility of the confidentiality of this discharge information lies with you and/or your care-partner.

## 2019-09-10 NOTE — Progress Notes (Signed)
Report given to PACU, vss 

## 2019-09-10 NOTE — Op Note (Signed)
[facility] Patient Name: Robert Benson Procedure Date: 09/10/2019 8:40 AM MRN: [id number] Endoscopist: Gatha Mayer , MD Age: 59 Referring MD:  Date of Birth: 09-28-1960 Gender: Male Account #: [id number] Procedure:                Colonoscopy Indications:              Screening for colorectal malignant neoplasm, This                            is the patient's first colonoscopy Medicines:                Propofol per Anesthesia, Monitored Anesthesia Care Procedure:                Pre-Anesthesia Assessment:                           - Prior to the procedure, a History and Physical                            was performed, and patient medications and                            allergies were reviewed. The patient's tolerance of                            previous anesthesia was also reviewed. The risks                            and benefits of the procedure and the sedation                            options and risks were discussed with the patient.                            All questions were answered, and informed consent                            was obtained. Prior Anticoagulants: The patient has                            taken no previous anticoagulant or antiplatelet                            agents. ASA Grade Assessment: III - A patient with                            severe systemic disease. After reviewing the risks                            and benefits, the patient was deemed in                            satisfactory condition to undergo the procedure.  After obtaining informed consent, the colonoscope                            was passed under direct vision. Throughout the                            procedure, the patient's blood pressure, pulse, and                            oxygen saturations were monitored continuously. The                            Colonoscope was introduced through the anus and   advanced to the the cecum, identified by                            appendiceal orifice and ileocecal valve. The                            colonoscopy was performed without difficulty. The                            patient tolerated the procedure well. The quality                            of the bowel preparation was excellent. The bowel                            preparation used was Miralax via split dose                            instruction. The ileocecal valve, appendiceal                            orifice, and rectum were photographed. Scope In: 8:55:22 AM Scope Out: 9:10:19 AM Scope Withdrawal Time: 0 hours 11 minutes 34 seconds  Total Procedure Duration: 0 hours 14 minutes 57 seconds  Findings:                 The perianal and digital rectal examinations were                            normal. Pertinent negatives include normal prostate                            (size, shape, and consistency).                           Multiple small and large-mouthed diverticula were                            found in the sigmoid colon.                           The exam was otherwise without abnormality on  direct and retroflexion views. Complications:            No immediate complications. Estimated Blood Loss:     Estimated blood loss: none. Impression:               - Diverticulosis in the sigmoid colon.                           - The examination was otherwise normal on direct                            and retroflexion views.                           - No specimens collected. Recommendation:           - Patient has a contact number available for                            emergencies. The signs and symptoms of potential                            delayed complications were discussed with the                            patient. Return to normal activities tomorrow.                            Written discharge instructions were provided to the                             patient.                           - Resume previous diet.                           - Continue present medications.                           - Repeat colonoscopy in 10 years for screening                            purposes. Iva Boop, MD 09/10/2019 9:16:09 AM This report has been signed electronically.

## 2019-09-14 ENCOUNTER — Telehealth: Payer: Self-pay

## 2019-09-14 NOTE — Telephone Encounter (Signed)
  Follow up Call-  Call back number 09/10/2019  Post procedure Call Back phone  # [phone]  Permission to leave phone message Yes  Some recent data might be hidden     Patient questions:  Do you have a fever, pain , or abdominal swelling? No. Pain Score  0 *  Have you tolerated food without any problems? Yes.    Have you been able to return to your normal activities? Yes.    Do you have any questions about your discharge instructions: Diet   No. Medications  No. Follow up visit  No.  Do you have questions or concerns about your Care? No.  Actions: * If pain score is 4 or above: No action needed, pain <4.  1. Have you developed a fever since your procedure? no  2.   Have you had an respiratory symptoms (SOB or cough) since your procedure? no  3.   Have you tested positive for COVID 19 since your procedure no  4.   Have you had any family members/close contacts diagnosed with the COVID 19 since your procedure?  no   If yes to any of these questions please route to Laverna Peace, RN and Jennye Boroughs, Charity fundraiser.

## 2019-10-05 MED FILL — ?CARVEDILOL 6.25 MG TABLET: 6.25 | 30 days supply | Qty: 60 | Fill #0

## 2019-10-29 MED FILL — ?CARVEDILOL 6.25 MG TABLET: 6.25 | 30 days supply | Qty: 60 | Fill #1

## 2019-10-29 MED FILL — ATORVASTATIN 80 MG TABLET: 80 | 30 days supply | Qty: 30 | Fill #0

## 2019-12-03 MED FILL — ?CARVEDILOL 6.25 MG TABLET: 6.25 | 30 days supply | Qty: 60 | Fill #2

## 2019-12-03 MED FILL — ATORVASTATIN 80 MG TABLET: 80 | 30 days supply | Qty: 30 | Fill #1

## 2020-01-11 MED FILL — ATORVASTATIN 80 MG TABLET: 80 | 30 days supply | Qty: 30 | Fill #2

## 2020-01-11 MED FILL — ?CARVEDILOL 6.25 MG TABLET: 6.25 | 30 days supply | Qty: 60 | Fill #3

## 2020-02-08 MED FILL — ?CARVEDILOL 6.25 MG TABLET: 6.25 | 30 days supply | Qty: 60 | Fill #4

## 2020-02-08 MED FILL — ATORVASTATIN 80 MG TABLET: 80 | 30 days supply | Qty: 30 | Fill #3

## 2020-02-25 ENCOUNTER — Ambulatory Visit: Payer: Medicaid Other | Admitting: Nurse Practitioner

## 2020-02-25 ENCOUNTER — Telehealth: Payer: Self-pay | Admitting: *Deleted

## 2020-02-25 NOTE — Telephone Encounter (Signed)
Mr.[facility] will call back when ready to schedule.

## 2020-02-29 ENCOUNTER — Encounter: Payer: Self-pay | Admitting: Nurse Practitioner

## 2020-02-29 ENCOUNTER — Ambulatory Visit (INDEPENDENT_AMBULATORY_CARE_PROVIDER_SITE_OTHER): Payer: Self-pay | Admitting: Nurse Practitioner

## 2020-02-29 ENCOUNTER — Other Ambulatory Visit: Payer: Self-pay

## 2020-02-29 VITALS — BP 100/71 | HR 70 | Temp 98.6°F | Ht 66.0 in | Wt 157.0 lb

## 2020-02-29 DIAGNOSIS — E785 Hyperlipidemia, unspecified: Secondary | ICD-10-CM

## 2020-02-29 DIAGNOSIS — I10 Essential (primary) hypertension: Secondary | ICD-10-CM

## 2020-02-29 DIAGNOSIS — Z0000 Encounter for general adult medical examination without abnormal findings: Secondary | ICD-10-CM

## 2020-02-29 DIAGNOSIS — I252 Old myocardial infarction: Secondary | ICD-10-CM

## 2020-02-29 LAB — POCT URINALYSIS DIPSTICK OB
Bilirubin, UA: NEGATIVE
Blood, UA: NEGATIVE
Glucose, UA: NEGATIVE
Ketones, UA: NEGATIVE
Leukocytes, UA: NEGATIVE
Nitrite, UA: NEGATIVE
POC,PROTEIN,UA: NEGATIVE
Spec Grav, UA: 1.015 (ref 1.010–1.025)
Urobilinogen, UA: 0.2 E.U./dL
pH, UA: 5.5 (ref 5.0–8.0)

## 2020-02-29 NOTE — Progress Notes (Signed)
[facility] Patient [facility] [address] [place], [place]  [address] Phone:  [phone]   Fax:  [phone]   Established Patient Office Visit  Subjective:  Patient ID: Robert Benson, male    DOB: 07-Sep-1960  Age: 59 y.o. MRN: [id number]  CC:  Chief Complaint  Patient presents with  . Follow-up    has stopped smoking, walking 2 miles per day    HPI Robert Benson presents for follow up. He  has a past medical history of Arthritis, Hyperlipidemia, Hypertension, Kidney stone, STEMI (ST elevation myocardial infarction) (HCC), and Substance abuse (HCC).   Hypertension Patient is here for follow-up of elevated blood pressure. He is exercising and is adherent to a low-salt diet. Blood pressure is not monitored at home. Cardiac symptoms: none. Patient denies chest pain, dyspnea, exertional chest pressure/discomfort, fatigue, irregular heart beat, palpitations and syncope. Cardiovascular risk factors: advanced age (older than 23 for men, 23 for women), dyslipidemia, hypertension and male gender. Use of agents associated with hypertension: none. History of target organ damage: angina/ prior myocardial infarction.  He is very proud that he has quit smoking.  He admits that he feels better overall.  Past Medical History:  Diagnosis Date  . Arthritis   . Hyperlipidemia   . Hypertension   . Kidney stone   . STEMI (ST elevation myocardial infarction) (HCC)    2019  . Substance abuse (HCC)    marijuana occasional    Past Surgical History:  Procedure Laterality Date  . CORONARY/GRAFT ACUTE MI REVASCULARIZATION N/A 03/29/2018   Procedure: Coronary/Graft Acute MI Revascularization;  Surgeon: Swaziland, Peter M, MD;  Location: [facility] INVASIVE CV LAB;  Service: Cardiovascular;  Laterality: N/A;  . HARDWARE REMOVAL Right 11/03/2014   Procedure: HARDWARE REMOVAL from middle finger;  Surgeon: Bradly Bienenstock, MD;  Location: [facility] OR;  Service: Orthopedics;  Laterality: Right;  . I & D EXTREMITY Right  08/23/2014   Procedure: EXPLORATION RIGHT HAND, THUMB INDEX, LONG AND RING FINGER, Index finger amputation, IP Fusion of Long Finger, ORIF of Thumb;  Surgeon: Bradly Bienenstock, MD;  Location: [facility] OR;  Service: Orthopedics;  Laterality: Right;  . LEFT HEART CATH AND CORONARY ANGIOGRAPHY N/A 03/29/2018   Procedure: LEFT HEART CATH AND CORONARY ANGIOGRAPHY;  Surgeon: Swazil[place], Peter M, MD;  Location: [facility] INVASIVE CV LAB;  Service: Cardiovascular;  Laterality: N/A;  . NERVE, TENDON AND ARTERY REPAIR Right 08/23/2014   Procedure: NERVE, TENDON, BONE AND ARTERY REPAIR AS NECESSARY;  Surgeon: Bradly Bienenstock, MD;  Location: [facility] OR;  Service: Orthopedics;  Laterality: Right;  . OPEN REDUCTION INTERNAL FIXATION (ORIF) HAND Right 08/23/2014   Procedure: OPEN REDUCTION INTERNAL FIXATION (ORIF) HAND;  Surgeon: Bradly Bienenstock, MD;  Location: [facility] OR;  Service: Orthopedics;  Laterality: Right;    Family History  Problem Relation Age of Onset  . Hypertension Father   . Colon cancer Neg Hx   . Esophageal cancer Neg Hx   . Rectal cancer Neg Hx   . Stomach cancer Neg Hx     Social History   Socioeconomic History  . Marital status: Married    Spouse name: Not on file  . Number of children: Not on file  . Years of education: Not on file  . Highest education level: Not on file  Occupational History  . Occupation: roofer/construction  Tobacco Use  . Smoking status: Former Smoker    Packs/day: 2.50    Years: 38.00    Pack years: 95.00  .  Smokeless tobacco: Never Used  . Tobacco comment: quit 2019  Vaping Use  . Vaping Use: Never used  Substance and Sexual Activity  . Alcohol use: Yes    Comment: once weekly- liquor  . Drug use: Yes    Types: Marijuana    Comment: uses occasionally  . Sexual activity: Not on file  Other Topics Concern  . Not on file  Social History Narrative  . Not on file   Social Determinants of Health   Financial Resource Strain:   . Difficulty of Paying Living Expenses:   Food Insecurity:    . Worried About Programme researcher, broadcasting/film/video in the Last Year:   . Barista in the Last Year:   Transportation Needs:   . Freight forwarder (Medical):   Marland Kitchen Lack of Transportation (Non-Medical):   Physical Activity:   . Days of Exercise per Week:   . Minutes of Exercise per Session:   Stress:   . Feeling of Stress :   Social Connections:   . Frequency of Communication with Friends and Family:   . Frequency of Social Gatherings with Friends and Family:   . Attends Religious Services:   . Active Member of Clubs or Organizations:   . Attends Banker Meetings:   Marland Kitchen Marital Status:   Intimate Partner Violence:   . Fear of Current or Ex-Partner:   . Emotionally Abused:   Marland Kitchen Physically Abused:   . Sexually Abused:     Outpatient Medications Prior to Visit  Medication Sig Dispense Refill  . aspirin 81 MG EC tablet Take 1 tablet (81 mg total) by mouth daily. 30 tablet   . carvedilol (COREG) 6.25 MG tablet TAKE 1 TABLET BY MOUTH TWICE DAILY WITH A MEAL. 180 tablet 1  . atorvastatin (LIPITOR) 80 MG tablet TAKE 1 TABLET BY MOTUH DAILY AT 6 PM. (Patient not taking: Reported on 02/29/2020) 90 tablet 1  . nitroGLYCERIN (NITROSTAT) 0.4 MG SL tablet Place 1 tablet (0.4 mg total) under the tongue every 5 (five) minutes x 3 doses as needed for chest pain. (Patient not taking: Reported on 09/01/2019) 25 tablet 2   No facility-administered medications prior to visit.    Allergies  Allergen Reactions  . Morphine And Related Nausea And Vomiting  . Percocet [Oxycodone-Acetaminophen] Itching and Nausea And Vomiting    ROS Review of Systems    Objective:    Physical Exam Constitutional:      General: He is not in acute distress.    Appearance: He is normal weight.  HENT:     Head: Normocephalic and atraumatic.     Nose: Nose normal.     Mouth/Throat:     Mouth: Mucous membranes are moist.  Eyes:     Pupils: Pupils are equal, round, and reactive to light.  Cardiovascular:       Rate and Rhythm: Normal rate and regular rhythm.     Pulses: Normal pulses.     Heart sounds: Normal heart sounds.  Pulmonary:     Effort: Pulmonary effort is normal.     Breath sounds: Normal breath sounds.  Abdominal:     General: Bowel sounds are normal.     Palpations: Abdomen is soft.     Comments: Abdominal wall weakness  Musculoskeletal:        General: Normal range of motion.     Cervical back: Normal range of motion.     Comments: amputation of digit 2 distal joint  Skin:    General: Skin is warm and dry.     Capillary Refill: Capillary refill takes less than 2 seconds.  Neurological:     General: No focal deficit present.     Mental Status: He is alert and oriented to person, place, and time.  Psychiatric:        Mood and Affect: Mood normal.        Behavior: Behavior normal.        Thought Content: Thought content normal.        Judgment: Judgment normal.     BP 100/71   Pulse 70   Temp 98.6 F (37 C)   Ht 5\' 6"  (1.676 m)   Wt 157 lb (71.2 kg)   SpO2 98%   BMI 25.34 kg/m  Wt Readings from Last 3 Encounters:  02/29/20 157 lb (71.2 kg)  09/10/19 158 lb (71.7 kg)  09/01/19 158 lb 12.8 oz (72 kg)     There are no preventive care reminders to display for this patient.  There are no preventive care reminders to display for this patient.  Lab Results  Component Value Date   TSH 0.541 03/29/2018   Lab Results  Component Value Date   WBC 10.9 (H) 04/01/2018   HGB 14.2 04/01/2018   HCT 43.5 04/01/2018   MCV 93.1 04/01/2018   PLT 200 04/01/2018   Lab Results  Component Value Date   NA 138 09/01/2019   K 4.8 09/01/2019   CO2 22 03/26/2019   GLUCOSE 93 09/01/2019   BUN 11 09/01/2019   CREATININE 1.03 09/01/2019   BILITOT 0.5 09/01/2019   ALKPHOS 124 (H) 09/01/2019   AST 18 09/01/2019   ALT 15 03/26/2019   PROT 7.4 09/01/2019   ALBUMIN 4.7 09/01/2019   CALCIUM 9.7 09/01/2019   ANIONGAP 14 04/01/2018   Lab Results  Component Value Date    CHOL 125 09/01/2019   Lab Results  Component Value Date   HDL 46 09/01/2019   Lab Results  Component Value Date   LDLCALC 64 09/01/2019   Lab Results  Component Value Date   TRIG 76 09/01/2019   Lab Results  Component Value Date   CHOLHDL 2.7 09/01/2019   Lab Results  Component Value Date   HGBA1C 5.7 (H) 03/29/2018      Assessment & Plan:   Problem List Items Addressed This Visit      Cardiovascular and Mediastinum   Hypertension - Primary Encouraged on going compliance with current medication regimen Encouraged home monitoring and recording BP <130/80 Eating a heart-healthy diet with less salt Encouraged regular physical activity  Recommend Weight loss Continue to remain tobacco free   Relevant Orders   Comp. Metabolic Panel (12)     Other   Hyperlipidemia.  Continue with current regimen. No changes needed at this time . Good compliance.   Relevant Orders   Lipid panel    Other Visit Diagnoses    Healthcare maintenance       Relevant Orders   POC Urinalysis Dipstick OB (Completed)   History of ST elevation myocardial infarction (STEMI)     Continue with current regimen Cardiology follow-up annually  Continue with lifestyle modifications      No orders of the defined types were placed in this encounter.   Follow-up: Return in about 3 months (around 05/31/2020).    06/02/2020, NP

## 2020-03-01 ENCOUNTER — Encounter: Payer: Self-pay | Admitting: Nurse Practitioner

## 2020-03-01 ENCOUNTER — Other Ambulatory Visit: Payer: Medicaid Other

## 2020-03-01 DIAGNOSIS — E785 Hyperlipidemia, unspecified: Secondary | ICD-10-CM

## 2020-03-01 DIAGNOSIS — I10 Essential (primary) hypertension: Secondary | ICD-10-CM

## 2020-03-02 LAB — COMP. METABOLIC PANEL (12)
AST: 16 IU/L (ref 0–40)
Albumin/Globulin Ratio: 1.7 (ref 1.2–2.2)
Albumin: 4 g/dL (ref 3.8–4.9)
Alkaline Phosphatase: 100 IU/L (ref 48–121)
BUN/Creatinine Ratio: 19 (ref 9–20)
BUN: 17 mg/dL (ref 6–24)
Bilirubin Total: 0.3 mg/dL (ref 0.0–1.2)
Calcium: 8.8 mg/dL (ref 8.7–10.2)
Chloride: 104 mmol/L (ref 96–106)
Creatinine, Ser: 0.9 mg/dL (ref 0.76–1.27)
GFR calc Af Amer: 109 mL/min/{1.73_m2} (ref >59)
GFR calc non Af Amer: 94 mL/min/{1.73_m2} (ref >59)
Globulin, Total: 2.3 g/dL (ref 1.5–4.5)
Glucose: 118 mg/dL — ABNORMAL HIGH (ref 65–99)
Potassium: 4.5 mmol/L (ref 3.5–5.2)
Sodium: 138 mmol/L (ref 134–144)
Total Protein: 6.3 g/dL (ref 6.0–8.5)

## 2020-03-02 LAB — LIPID PANEL
Chol/HDL Ratio: 2.9 ratio (ref 0.0–5.0)
Cholesterol, Total: 113 mg/dL (ref 100–199)
HDL: 39 mg/dL — ABNORMAL LOW (ref >39)
LDL Chol Calc (NIH): 59 mg/dL (ref 0–99)
Triglycerides: 75 mg/dL (ref 0–149)
VLDL Cholesterol Cal: 15 mg/dL (ref 5–40)

## 2020-03-16 MED FILL — ATORVASTATIN 80 MG TABLET: 80 | 30 days supply | Qty: 30 | Fill #4

## 2020-03-16 MED FILL — CARVEDILOL 6.25 MG TABLET: 6.25 | 30 days supply | Qty: 60 | Fill #5

## 2020-03-27 NOTE — Progress Notes (Deleted)
Cardiology Office Note:    Date:  03/27/2020   ID:  Robert Benson, DOB 05-29-1961, MRN [id number]  PCP:  Mike Gip, FNP  Cardiologist:  Garnett Nunziata [place], MD   Referring MD: Mike Gip, FNP   No chief complaint on file.   History of Present Illness:    Robert Benson is a 59 y.o. male seen for follow up CAD. He presented in September 2019 with an acute inferior STEMI. He was taken emergently to the Cath Lab.  Heart cath revealed RCA with a heavy thrombus burden treated with aspiration thrombectomy and DES.  LV gram with inferior wall motion abnormality and LVEF of 45-50%, confirmed with echocardiogram.  Grade 1 diastolic dysfunction.   Post intervention, he had nonsustained ventricular tachycardia.    On follow up today he is doing well. He has not resumed smoking since his heart attack. He has gained some weight.  He denies chest pain, SOB, DOE, orthopnea, and lower extremity swelling. He is compliant on medications. Knows he needs to do better with diet. Eating a lot of eggs and Oreos.    Past Medical History:  Diagnosis Date  . Arthritis   . Hyperlipidemia   . Hypertension   . Kidney stone   . STEMI (ST elevation myocardial infarction) (HCC)    2019  . Substance abuse (HCC)    marijuana occasional    Past Surgical History:  Procedure Laterality Date  . CORONARY/GRAFT ACUTE MI REVASCULARIZATION N/A 03/29/2018   Procedure: Coronary/Graft Acute MI Revascularization;  Surgeon: [place]il[place], Shian Goodnow M, MD;  Location: [facility] INVASIVE CV LAB;  Service: Cardiovascular;  Laterality: N/A;  . HARDWARE REMOVAL Right 11/03/2014   Procedure: HARDWARE REMOVAL from middle finger;  Surgeon: Bradly Bienenstock, MD;  Location: [facility] OR;  Service: Orthopedics;  Laterality: Right;  . I & D EXTREMITY Right 08/23/2014   Procedure: EXPLORATION RIGHT HAND, THUMB INDEX, LONG AND RING FINGER, Index finger amputation, IP Fusion of Long Finger, ORIF of Thumb;  Surgeon: Bradly Bienenstock, MD;  Location: [facility] OR;  Service:  Orthopedics;  Laterality: Right;  . LEFT HEART CATH AND CORONARY ANGIOGRAPHY N/A 03/29/2018   Procedure: LEFT HEART CATH AND CORONARY ANGIOGRAPHY;  Surgeon: [place], Ernest Popowski M, MD;  Location: [facility] INVASIVE CV LAB;  Service: Cardiovascular;  Laterality: N/A;  . NERVE, TENDON AND ARTERY REPAIR Right 08/23/2014   Procedure: NERVE, TENDON, BONE AND ARTERY REPAIR AS NECESSARY;  Surgeon: Bradly Bienenstock, MD;  Location: [facility] OR;  Service: Orthopedics;  Laterality: Right;  . OPEN REDUCTION INTERNAL FIXATION (ORIF) HAND Right 08/23/2014   Procedure: OPEN REDUCTION INTERNAL FIXATION (ORIF) HAND;  Surgeon: Bradly Bienenstock, MD;  Location: [facility] OR;  Service: Orthopedics;  Laterality: Right;    Current Medications: No outpatient medications have been marked as taking for the 03/29/20 encounter (Appointment) with [place], Grady Lucci M, MD.     Allergies:   Morphine and related and Percocet [oxycodone-acetaminophen]   Social History   Socioeconomic History  . Marital status: Married    Spouse name: Not on file  . Number of children: Not on file  . Years of education: Not on file  . Highest education level: Not on file  Occupational History  . Occupation: roofer/construction  Tobacco Use  . Smoking status: Former Smoker    Packs/day: 2.50    Years: 38.00    Pack years: 95.00  . Smokeless tobacco: Never Used  . Tobacco comment: quit 2019  Vaping Use  . Vaping Use: Never used  Substance and Sexual Activity  .  Alcohol use: Yes    Comment: once weekly- liquor  . Drug use: Yes    Types: Marijuana    Comment: uses occasionally  . Sexual activity: Not on file  Other Topics Concern  . Not on file  Social History Narrative  . Not on file   Social Determinants of Health   Financial Resource Strain:   . Difficulty of Paying Living Expenses: Not on file  Food Insecurity:   . Worried About Programme researcher, broadcasting/film/video in the Last Year: Not on file  . Ran Out of Food in the Last Year: Not on file  Transportation Needs:   . Lack  of Transportation (Medical): Not on file  . Lack of Transportation (Non-Medical): Not on file  Physical Activity:   . Days of Exercise per Week: Not on file  . Minutes of Exercise per Session: Not on file  Stress:   . Feeling of Stress : Not on file  Social Connections:   . Frequency of Communication with Friends and Family: Not on file  . Frequency of Social Gatherings with Friends and Family: Not on file  . Attends Religious Services: Not on file  . Active Member of Clubs or Organizations: Not on file  . Attends Banker Meetings: Not on file  . Marital Status: Not on file     Family History: The patient's family history includes Hypertension in his father. There is no history of Colon cancer, Esophageal cancer, Rectal cancer, or Stomach cancer.  ROS:   Please see the history of present illness.    All other systems reviewed and are negative.  EKGs/Labs/Other Studies Reviewed:    The following studies were reviewed today:  Cath: 03/29/18   Prox RCA lesion is 99% stenosed.  Post intervention, there is a 0% residual stenosis.  A drug-eluting stent was successfully placed using a STENT SYNERGY DES 3.5X16.  There is mild left ventricular systolic dysfunction.  LV end diastolic pressure is normal.  The left ventricular ejection fraction is 45-50% by visual estimate.  1. Single vessel occlusive CAD involving the proximal RCA with large thrombus burden. 2. Mild LV dysfunction with inferior wall motion abnormality 3. Normal LVEDP 4. Successful PCI of the RCA with aspiration thrombectomy and stenting with DES x 1.  Plan: DAPT for one year. IV Aggrastat for 18 hours. High dose statin. May be a candidate for fast track DC if no complications.   Recommend uninterrupted dual antiplatelet therapy with Aspirin 81mg  daily and Ticagrelor 90mg  twice daily for a minimum of 12 months (ACS - Class I recommendation).   TTE: 03/31/18 Study Conclusions - Left  ventricle: The cavity size was normal. Systolic function was mildly reduced. The estimated ejection fraction was in the range of 45% to 50%. There is akinesis of the basal-midinferoseptal myocardium. Doppler parameters are consistent with abnormal left ventricular relaxation (grade 1 diastolic dysfunction). - Mitral valve: There was moderate regurgitation  Echo 12/02/18: IMPRESSIONS    1. The left ventricle has mildly reduced systolic function, with an ejection fraction of 45-50%. The cavity size was normal. Left ventricular diastolic parameters were normal.  2. The right ventricle has normal systolic function. The cavity was normal.  3. The mitral valve is grossly normal.  4. The tricuspid valve is grossly normal.  5. The aortic valve is tricuspid. Mild thickening of the aortic valve. No stenosis of the aortic valve.  6. Severe hypokinesis of the basal inferior and inferolateral walls; overall mild LV dysfunction; mild  MR.   EKG:  EKG is ordered today.  The ekg ordered today demonstrates sinus with old inferior infarct. I have personally reviewed and interpreted this study.   Recent Labs: 03/01/2020: BUN 17; Creatinine, Ser 0.90; Potassium 4.5; Sodium 138  Recent Lipid Panel    Component Value Date/Time   CHOL 113 03/01/2020 1007   TRIG 75 03/01/2020 1007   HDL 39 (L) 03/01/2020 1007   CHOLHDL 2.9 03/01/2020 1007   CHOLHDL 3.4 03/30/2018 0233   VLDL 10 03/30/2018 0233   LDLCALC 59 03/01/2020 1007    Physical Exam:    VS:  There were no vitals taken for this visit.    Wt Readings from Last 3 Encounters:  02/29/20 157 lb (71.2 kg)  09/10/19 158 lb (71.7 kg)  09/01/19 158 lb 12.8 oz (72 kg)     GEN: Well nourished, well developed in no acute distress HEENT: Normal NECK: No JVD; No carotid bruits CARDIAC: RRR, no murmurs, rubs, gallops RESPIRATORY:  Clear to auscultation without rales, wheezing or rhonchi  ABDOMEN: Soft, non-tender,  non-distended MUSCULOSKELETAL:  No edema; No deformity right radial C/D/I SKIN: Warm and dry NEUROLOGIC:  Alert and oriented x 3 PSYCHIATRIC:  Normal affect   ASSESSMENT:    No diagnosis found. PLAN:    In order of problems listed above:  1. Coronary artery disease involving native coronary artery of native heart without angina pectoris  STEMI s/p thrombectomy and DES to RCA- September 14,2019  He is asymptomatic. Continue ASA, statin, and Coreg. May stop Brilinta at this time.   2. Essential Hypertension Well controlled on Coreg.    3. Hyperlipidemia, unspecified hyperlipidemia type - on high dose statin. Will check lab work today.   4. Tobacco abuse He has quit smoking cigarettes one year ago.     Medication Adjustments/Labs and Tests Ordered: Current medicines are reviewed at length with the patient today.  Concerns regarding medicines are outlined above.  No orders of the defined types were placed in this encounter.  No orders of the defined types were placed in this encounter.   Signed, Emory Leaver [place], MD  03/27/2020 10:04 AM    [facility]

## 2020-03-29 ENCOUNTER — Ambulatory Visit: Payer: Self-pay | Admitting: Cardiology

## 2020-04-08 ENCOUNTER — Encounter: Payer: Self-pay | Admitting: Cardiology

## 2020-04-08 ENCOUNTER — Ambulatory Visit (INDEPENDENT_AMBULATORY_CARE_PROVIDER_SITE_OTHER): Payer: Self-pay | Admitting: Cardiology

## 2020-04-08 ENCOUNTER — Other Ambulatory Visit: Payer: Self-pay

## 2020-04-08 VITALS — BP 122/74 | HR 76 | Ht 67.0 in | Wt 162.0 lb

## 2020-04-08 DIAGNOSIS — E7800 Pure hypercholesterolemia, unspecified: Secondary | ICD-10-CM

## 2020-04-08 DIAGNOSIS — I2510 Atherosclerotic heart disease of native coronary artery without angina pectoris: Secondary | ICD-10-CM

## 2020-04-08 DIAGNOSIS — I10 Essential (primary) hypertension: Secondary | ICD-10-CM

## 2020-04-08 NOTE — Progress Notes (Signed)
Cardiology Office Note:    Date:  04/08/2020   ID:  Robert Benson, DOB 1961/07/10, MRN [id number]  PCP:  Mike Gip, FNP  Cardiologist:  Roena Sassaman [place], MD   Referring MD: Mike Gip, FNP   Chief Complaint  Patient presents with  . Coronary Artery Disease    History of Present Illness:    Robert Benson is a 59 y.o. male seen for follow up CAD. He presented in September 2019 with an acute inferior STEMI. He was taken emergently to the Cath Lab.  Heart cath revealed RCA with a heavy thrombus burden treated with aspiration thrombectomy and DES.  LV gram with inferior wall motion abnormality and LVEF of 45-50%, confirmed with echocardiogram.  Grade 1 diastolic dysfunction.   Post intervention, he had nonsustained ventricular tachycardia.    On follow up today he is doing well. He has not resumed smoking since his heart attack. He denies chest pain, SOB, DOE, orthopnea, and lower extremity swelling. He is compliant on medications. Walking daily for 2 miles.    Past Medical History:  Diagnosis Date  . Arthritis   . Hyperlipidemia   . Hypertension   . Kidney stone   . STEMI (ST elevation myocardial infarction) (HCC)    2019  . Substance abuse (HCC)    marijuana occasional    Past Surgical History:  Procedure Laterality Date  . CORONARY/GRAFT ACUTE MI REVASCULARIZATION N/A 03/29/2018   Procedure: Coronary/Graft Acute MI Revascularization;  Surgeon: Sw[place], Davina Howlett M, MD;  Location: [facility] INVASIVE CV LAB;  Service: Cardiovascular;  Laterality: N/A;  . HARDWARE REMOVAL Right 11/03/2014   Procedure: HARDWARE REMOVAL from middle finger;  Surgeon: Bradly Bienenstock, MD;  Location: [facility] OR;  Service: Orthopedics;  Laterality: Right;  . I & D EXTREMITY Right 08/23/2014   Procedure: EXPLORATION RIGHT HAND, THUMB INDEX, LONG AND RING FINGER, Index finger amputation, IP Fusion of Long Finger, ORIF of Thumb;  Surgeon: Bradly Bienenstock, MD;  Location: [facility] OR;  Service: Orthopedics;  Laterality: Right;  . LEFT  HEART CATH AND CORONARY ANGIOGRAPHY N/A 03/29/2018   Procedure: LEFT HEART CATH AND CORONARY ANGIOGRAPHY;  Surgeon: [place], Shaune Malacara M, MD;  Location: [facility] INVASIVE CV LAB;  Service: Cardiovascular;  Laterality: N/A;  . NERVE, TENDON AND ARTERY REPAIR Right 08/23/2014   Procedure: NERVE, TENDON, BONE AND ARTERY REPAIR AS NECESSARY;  Surgeon: Bradly Bienenstock, MD;  Location: [facility] OR;  Service: Orthopedics;  Laterality: Right;  . OPEN REDUCTION INTERNAL FIXATION (ORIF) HAND Right 08/23/2014   Procedure: OPEN REDUCTION INTERNAL FIXATION (ORIF) HAND;  Surgeon: Bradly Bienenstock, MD;  Location: [facility] OR;  Service: Orthopedics;  Laterality: Right;    Current Medications: Current Meds  Medication Sig  . aspirin 81 MG EC tablet Take 1 tablet (81 mg total) by mouth daily.  Marland Kitchen atorvastatin (LIPITOR) 80 MG tablet TAKE 1 TABLET BY MOTUH DAILY AT 6 PM.  . carvedilol (COREG) 6.25 MG tablet TAKE 1 TABLET BY MOUTH TWICE DAILY WITH A MEAL.  . nitroGLYCERIN (NITROSTAT) 0.4 MG SL tablet Place 1 tablet (0.4 mg total) under the tongue every 5 (five) minutes x 3 doses as needed for chest pain.     Allergies:   Morphine and related and Percocet [oxycodone-acetaminophen]   Social History   Socioeconomic History  . Marital status: Married    Spouse name: Not on file  . Number of children: Not on file  . Years of education: Not on file  . Highest education level: Not on file  Occupational  History  . Occupation: roofer/construction  Tobacco Use  . Smoking status: Former Smoker    Packs/day: 2.50    Years: 38.00    Pack years: 95.00  . Smokeless tobacco: Never Used  . Tobacco comment: quit 2019  Vaping Use  . Vaping Use: Never used  Substance and Sexual Activity  . Alcohol use: Yes    Comment: once weekly- liquor  . Drug use: Yes    Types: Marijuana    Comment: uses occasionally  . Sexual activity: Not on file  Other Topics Concern  . Not on file  Social History Narrative  . Not on file   Social Determinants of Health     Financial Resource Strain:   . Difficulty of Paying Living Expenses: Not on file  Food Insecurity:   . Worried About Programme researcher, broadcasting/film/video in the Last Year: Not on file  . Ran Out of Food in the Last Year: Not on file  Transportation Needs:   . Lack of Transportation (Medical): Not on file  . Lack of Transportation (Non-Medical): Not on file  Physical Activity:   . Days of Exercise per Week: Not on file  . Minutes of Exercise per Session: Not on file  Stress:   . Feeling of Stress : Not on file  Social Connections:   . Frequency of Communication with Friends and Family: Not on file  . Frequency of Social Gatherings with Friends and Family: Not on file  . Attends Religious Services: Not on file  . Active Member of Clubs or Organizations: Not on file  . Attends Banker Meetings: Not on file  . Marital Status: Not on file     Family History: The patient's family history includes Hypertension in his father. There is no history of Colon cancer, Esophageal cancer, Rectal cancer, or Stomach cancer.  ROS:   Please see the history of present illness.    All other systems reviewed and are negative.  EKGs/Labs/Other Studies Reviewed:    The following studies were reviewed today:  Cath: 03/29/18   Prox RCA lesion is 99% stenosed.  Post intervention, there is a 0% residual stenosis.  A drug-eluting stent was successfully placed using a STENT SYNERGY DES 3.5X16.  There is mild left ventricular systolic dysfunction.  LV end diastolic pressure is normal.  The left ventricular ejection fraction is 45-50% by visual estimate.  1. Single vessel occlusive CAD involving the proximal RCA with large thrombus burden. 2. Mild LV dysfunction with inferior wall motion abnormality 3. Normal LVEDP 4. Successful PCI of the RCA with aspiration thrombectomy and stenting with DES x 1.  Plan: DAPT for one year. IV Aggrastat for 18 hours. High dose statin. May be a candidate for  fast track DC if no complications.   Recommend uninterrupted dual antiplatelet therapy with Aspirin 81mg  daily and Ticagrelor 90mg  twice daily for a minimum of 12 months (ACS - Class I recommendation).   TTE: 03/31/18 Study Conclusions - Left ventricle: The cavity size was normal. Systolic function was mildly reduced. The estimated ejection fraction was in the range of 45% to 50%. There is akinesis of the basal-midinferoseptal myocardium. Doppler parameters are consistent with abnormal left ventricular relaxation (grade 1 diastolic dysfunction). - Mitral valve: There was moderate regurgitation  Echo 12/02/18: IMPRESSIONS    1. The left ventricle has mildly reduced systolic function, with an ejection fraction of 45-50%. The cavity size was normal. Left ventricular diastolic parameters were normal.  2. The right ventricle  has normal systolic function. The cavity was normal.  3. The mitral valve is grossly normal.  4. The tricuspid valve is grossly normal.  5. The aortic valve is tricuspid. Mild thickening of the aortic valve. No stenosis of the aortic valve.  6. Severe hypokinesis of the basal inferior and inferolateral walls; overall mild LV dysfunction; mild MR.   EKG:  EKG is not ordered today.    Recent Labs: 03/01/2020: BUN 17; Creatinine, Ser 0.90; Potassium 4.5; Sodium 138  Recent Lipid Panel    Component Value Date/Time   CHOL 113 03/01/2020 1007   TRIG 75 03/01/2020 1007   HDL 39 (L) 03/01/2020 1007   CHOLHDL 2.9 03/01/2020 1007   CHOLHDL 3.4 03/30/2018 0233   VLDL 10 03/30/2018 0233   LDLCALC 59 03/01/2020 1007    Physical Exam:    VS:  BP 122/74   Pulse 76   Ht 5\' 7"  (1.702 m)   Wt 162 lb (73.5 kg)   SpO2 98%   BMI 25.37 kg/m     Wt Readings from Last 3 Encounters:  04/08/20 162 lb (73.5 kg)  02/29/20 157 lb (71.2 kg)  09/10/19 158 lb (71.7 kg)     GEN: Well nourished, well developed in no acute distress HEENT: Normal NECK: No JVD; No  carotid bruits CARDIAC: RRR, no murmurs, rubs, gallops RESPIRATORY:  Clear to auscultation without rales, wheezing or rhonchi  ABDOMEN: Soft, non-tender, non-distended MUSCULOSKELETAL:  No edema; No deformity right radial C/D/I SKIN: Warm and dry NEUROLOGIC:  Alert and oriented x 3 PSYCHIATRIC:  Normal affect   ASSESSMENT:    1. Coronary artery disease involving native coronary artery of native heart without angina pectoris   2. Essential hypertension   3. Hypercholesterolemia    PLAN:    In order of problems listed above:  1. Coronary artery disease involving native coronary artery of native heart without angina pectoris  STEMI s/p thrombectomy and DES to RCA- September 14,2019  He is asymptomatic. Continue ASA, statin, and Coreg.    2. Essential Hypertension Well controlled on Coreg.    3. Hyperlipidemia, unspecified hyperlipidemia type - on high dose statin. Excellent control.    4. Tobacco abuse He has quit smoking cigarettes 2 years ago.     Medication Adjustments/Labs and Tests Ordered: Current medicines are reviewed at length with the patient today.  Concerns regarding medicines are outlined above.  No orders of the defined types were placed in this encounter.  No orders of the defined types were placed in this encounter.   Signed, Azel Gumina September 16,2019, MD  04/08/2020 3:53 PM    [facility]

## 2020-04-15 ENCOUNTER — Other Ambulatory Visit: Payer: Self-pay | Admitting: Nurse Practitioner

## 2020-04-15 DIAGNOSIS — I10 Essential (primary) hypertension: Secondary | ICD-10-CM

## 2020-04-15 MED FILL — CARVEDILOL 6.25 MG TABLET: 6.25 | 30 days supply | Qty: 60 | Fill #0

## 2020-04-15 MED FILL — ATORVASTATIN CALCIUM 80 MG: 80 | 30 days supply | Qty: 30 | Fill #5

## 2020-05-20 MED FILL — ?CARVEDILOL 6.25 MG TABLET: 6.25 | 30 days supply | Qty: 60 | Fill #1

## 2020-05-30 ENCOUNTER — Other Ambulatory Visit: Payer: Self-pay | Admitting: Nurse Practitioner

## 2020-05-30 ENCOUNTER — Telehealth: Payer: Self-pay | Admitting: Nurse Practitioner

## 2020-05-30 DIAGNOSIS — I10 Essential (primary) hypertension: Secondary | ICD-10-CM

## 2020-05-30 DIAGNOSIS — I2111 ST elevation (STEMI) myocardial infarction involving right coronary artery: Secondary | ICD-10-CM

## 2020-05-30 MED ORDER — ATORVASTATIN CALCIUM 80 MG PO TABS: ORAL_TABLET | ORAL | 3 refills | Status: DC

## 2020-05-30 MED FILL — ATORVASTATIN CALCIUM 80 MG: 80 | 30 days supply | Qty: 30 | Fill #0

## 2020-05-30 NOTE — Telephone Encounter (Signed)
Sent!

## 2020-07-04 ENCOUNTER — Other Ambulatory Visit: Payer: Self-pay | Admitting: Nurse Practitioner

## 2020-07-04 DIAGNOSIS — I10 Essential (primary) hypertension: Secondary | ICD-10-CM

## 2020-07-04 MED FILL — ATORVASTATIN CALCIUM 80 MG: 80 | 30 days supply | Qty: 30 | Fill #1

## 2020-07-04 MED FILL — ?CARVEDILOL 6.25 MG TABLET: 6.25 | 30 days supply | Qty: 60 | Fill #0

## 2020-08-03 MED FILL — ATORVASTATIN CALCIUM 80 MG: 80 | 30 days supply | Qty: 30 | Fill #2

## 2020-08-03 MED FILL — ?CARVEDILOL 6.25 MG TABLET: 6.25 | 30 days supply | Qty: 60 | Fill #1

## 2020-08-22 IMAGING — DX DG CHEST 1V PORT
1 series · 1 of 1 positions shown · non-contrast
Comparison: None.

CLINICAL DATA: Pain.  Code STEMI.

EXAM:
PORTABLE CHEST 1 VIEW

[chest ap]
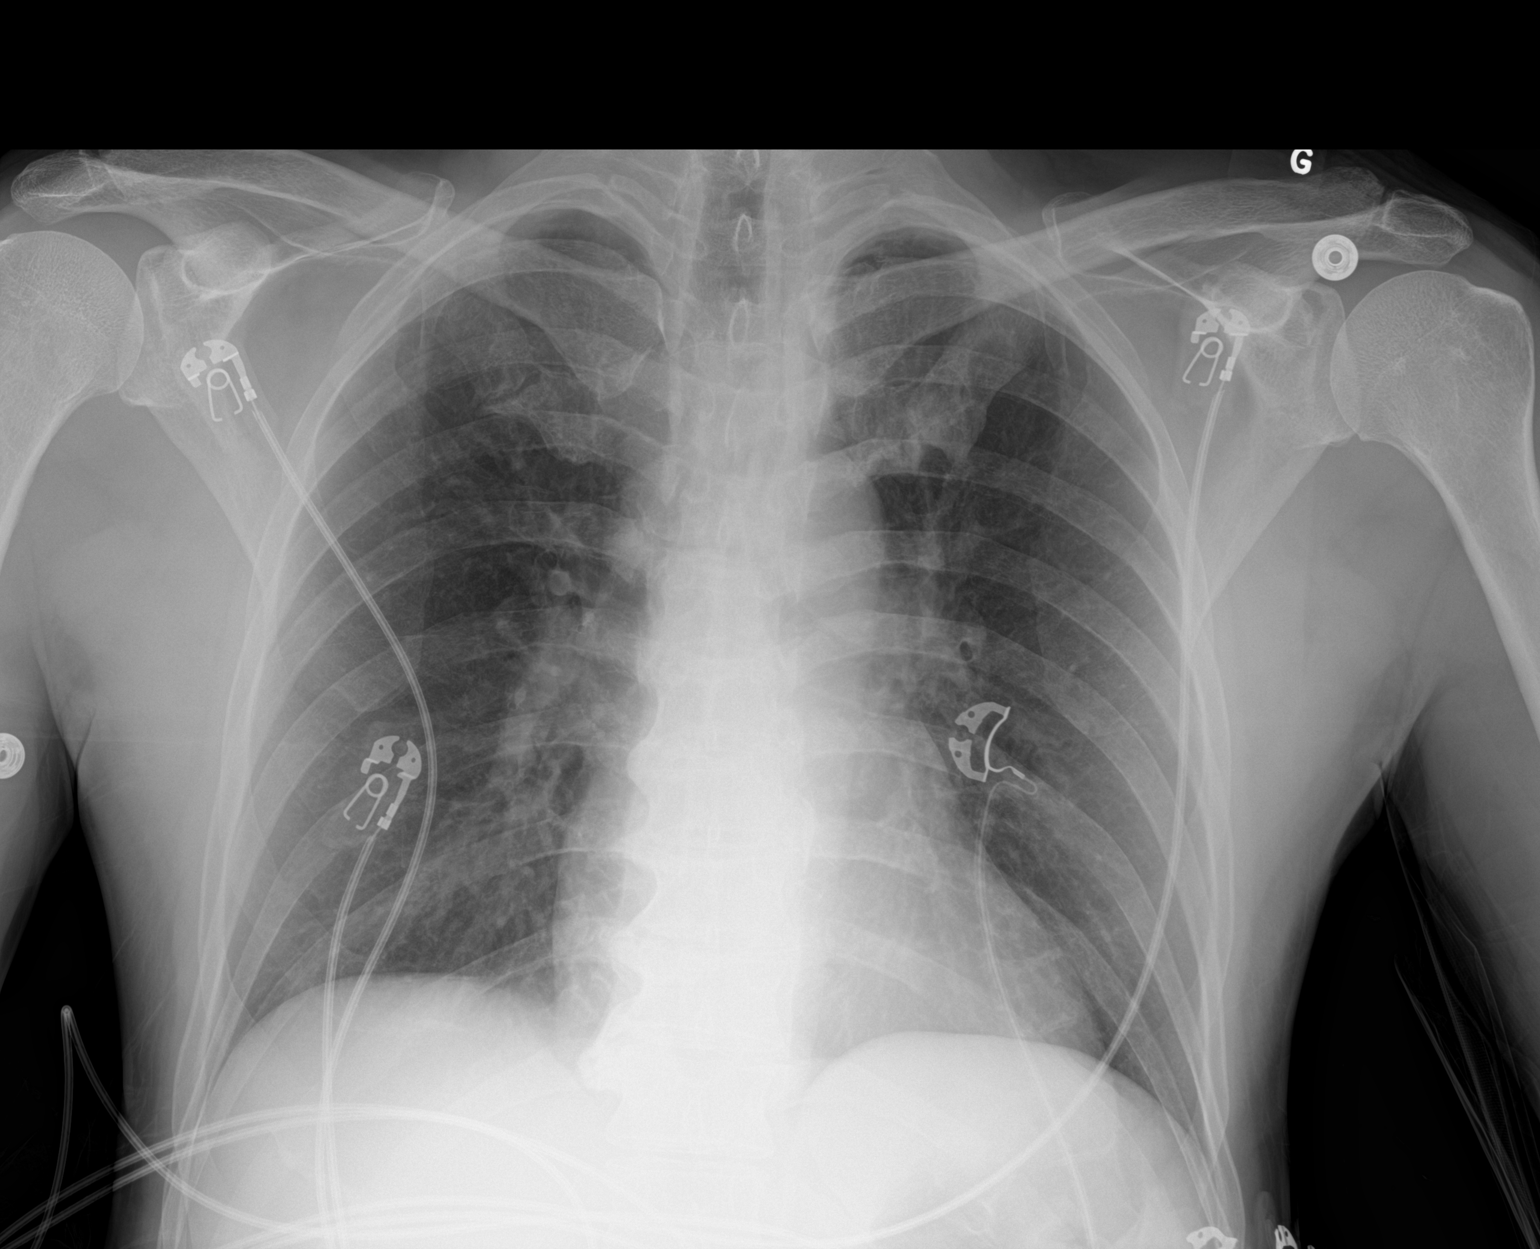

[1 of 1 positions shown; findings below may reference images not displayed]

FINDINGS: Lung volumes are low.The cardiomediastinal contours are normal. The
lungs are clear. Pulmonary vasculature is normal. No consolidation,
pleural effusion, or pneumothorax. No acute osseous abnormalities
are seen. Degenerative change in the spine.
IMPRESSION: Low lung volumes without acute finding.

## 2020-09-05 ENCOUNTER — Other Ambulatory Visit: Payer: Self-pay | Admitting: Nurse Practitioner

## 2020-09-05 DIAGNOSIS — I10 Essential (primary) hypertension: Secondary | ICD-10-CM

## 2020-09-05 MED FILL — ATORVASTATIN CALCIUM 80 MG: 80 | 30 days supply | Qty: 30 | Fill #3

## 2020-09-05 NOTE — Telephone Encounter (Signed)
Please see refill request.

## 2020-09-07 ENCOUNTER — Other Ambulatory Visit: Payer: Self-pay | Admitting: Nurse Practitioner

## 2020-09-07 MED FILL — ?CARVEDILOL 6.25 MG TABLET: 6.25 | 30 days supply | Qty: 60 | Fill #0

## 2020-10-13 MED FILL — ?CARVEDILOL 6.25 MG TABLET: 6.25 | 30 days supply | Qty: 60 | Fill #1

## 2020-10-13 MED FILL — ATORVASTATIN CALCIUM 80 MG: 80 | 30 days supply | Qty: 30 | Fill #4

## 2020-11-15 ENCOUNTER — Other Ambulatory Visit: Payer: Self-pay

## 2020-11-15 ENCOUNTER — Other Ambulatory Visit: Payer: Self-pay | Admitting: Nurse Practitioner

## 2020-11-15 DIAGNOSIS — I10 Essential (primary) hypertension: Secondary | ICD-10-CM

## 2020-11-15 MED FILL — Atorvastatin Calcium Tab 80 MG (Base Equivalent): ORAL | 30 days supply | Qty: 30 | Fill #0 | Status: AC

## 2020-11-16 ENCOUNTER — Other Ambulatory Visit: Payer: Self-pay

## 2020-11-16 MED ORDER — CARVEDILOL 6.25 MG PO TABS
ORAL_TABLET | Freq: Two times a day (BID) | ORAL | 1 refills | Status: DC
  Filled 2020-11-16: qty 60, 30d supply, fill #0
  Filled 2020-12-16: qty 60, 30d supply, fill #1

## 2020-11-17 ENCOUNTER — Other Ambulatory Visit: Payer: Self-pay

## 2020-12-16 ENCOUNTER — Other Ambulatory Visit: Payer: Self-pay

## 2020-12-16 MED FILL — Atorvastatin Calcium Tab 80 MG (Base Equivalent): ORAL | 30 days supply | Qty: 30 | Fill #1 | Status: AC

## 2021-01-20 ENCOUNTER — Other Ambulatory Visit: Payer: Self-pay | Admitting: Nurse Practitioner

## 2021-01-20 ENCOUNTER — Other Ambulatory Visit: Payer: Self-pay

## 2021-01-20 DIAGNOSIS — I10 Essential (primary) hypertension: Secondary | ICD-10-CM

## 2021-01-20 MED ORDER — CARVEDILOL 6.25 MG PO TABS
ORAL_TABLET | Freq: Two times a day (BID) | ORAL | 1 refills | Status: DC
  Filled 2021-01-20: qty 60, 30d supply, fill #0
  Filled 2021-02-21: qty 60, 30d supply, fill #1

## 2021-01-20 MED FILL — Atorvastatin Calcium Tab 80 MG (Base Equivalent): ORAL | 30 days supply | Qty: 30 | Fill #2 | Status: AC

## 2021-01-23 ENCOUNTER — Other Ambulatory Visit: Payer: Self-pay

## 2021-02-21 ENCOUNTER — Other Ambulatory Visit: Payer: Self-pay

## 2021-02-21 MED FILL — Atorvastatin Calcium Tab 80 MG (Base Equivalent): ORAL | 30 days supply | Qty: 30 | Fill #3 | Status: AC

## 2021-03-24 ENCOUNTER — Other Ambulatory Visit: Payer: Self-pay

## 2021-03-24 ENCOUNTER — Other Ambulatory Visit: Payer: Self-pay | Admitting: Nurse Practitioner

## 2021-03-24 DIAGNOSIS — I10 Essential (primary) hypertension: Secondary | ICD-10-CM

## 2021-03-24 MED FILL — Atorvastatin Calcium Tab 80 MG (Base Equivalent): ORAL | 30 days supply | Qty: 30 | Fill #4 | Status: AC

## 2021-03-29 ENCOUNTER — Other Ambulatory Visit: Payer: Self-pay

## 2021-03-30 ENCOUNTER — Telehealth: Payer: Self-pay

## 2021-03-30 NOTE — Telephone Encounter (Signed)
Heart med to be refilled   Appt on file for f/u

## 2021-03-31 ENCOUNTER — Other Ambulatory Visit: Payer: Self-pay | Admitting: Nurse Practitioner

## 2021-03-31 ENCOUNTER — Other Ambulatory Visit: Payer: Self-pay

## 2021-03-31 DIAGNOSIS — I10 Essential (primary) hypertension: Secondary | ICD-10-CM

## 2021-03-31 MED ORDER — CARVEDILOL 6.25 MG PO TABS
ORAL_TABLET | Freq: Two times a day (BID) | ORAL | 1 refills | Status: DC
  Filled 2021-03-31: qty 60, 30d supply, fill #0
  Filled 2021-05-01: qty 60, 30d supply, fill #1

## 2021-04-03 ENCOUNTER — Other Ambulatory Visit: Payer: Self-pay

## 2021-05-01 ENCOUNTER — Other Ambulatory Visit: Payer: Self-pay

## 2021-05-01 MED FILL — Atorvastatin Calcium Tab 80 MG (Base Equivalent): ORAL | 30 days supply | Qty: 30 | Fill #5 | Status: AC

## 2021-05-10 ENCOUNTER — Other Ambulatory Visit: Payer: Self-pay

## 2021-05-10 ENCOUNTER — Encounter: Payer: Self-pay | Admitting: Nurse Practitioner

## 2021-05-10 ENCOUNTER — Ambulatory Visit (INDEPENDENT_AMBULATORY_CARE_PROVIDER_SITE_OTHER): Payer: Self-pay | Admitting: Nurse Practitioner

## 2021-05-10 VITALS — BP 124/80 | HR 65 | Temp 98.2°F | Ht 66.0 in | Wt 161.2 lb

## 2021-05-10 DIAGNOSIS — Z125 Encounter for screening for malignant neoplasm of prostate: Secondary | ICD-10-CM

## 2021-05-10 DIAGNOSIS — E785 Hyperlipidemia, unspecified: Secondary | ICD-10-CM

## 2021-05-10 DIAGNOSIS — I10 Essential (primary) hypertension: Secondary | ICD-10-CM

## 2021-05-10 NOTE — Progress Notes (Signed)
[facility] Patient [facility] [address] [place], [place]  [address] Phone:  [phone]   Fax:  [phone]   Established Patient Office Visit  Subjective:  Patient ID: Robert Benson, male    DOB: 1961-03-21  Age: 60 y.o. MRN: [id number]  CC:  Chief Complaint  Patient presents with   Follow-up    Pt is here today for his follow up visit.     HPI Robert Benson presents for follow up. He  has a past medical history of Arthritis, Hyperlipidemia, Hypertension, Kidney stone, STEMI (ST elevation myocardial infarction) (HCC), and Substance abuse (HCC).    Hypertension Patient is here for follow-up of elevated blood pressure. He is exercising and is adherent to a low-salt diet. Blood pressure is well controlled at home. Cardiac symptoms:  dizziness . Patient denies chest pain, chest pressure/discomfort, dyspnea, exertional chest pressure/discomfort, fatigue, irregular heart beat, lower extremity edema, and palpitations. Cardiovascular risk factors: advanced age (older than 48 for men, 29 for women), dyslipidemia, hypertension, and male gender. Use of agents associated with hypertension: none. History of target organ damage: angina/ prior myocardial infarction. He works a little.  He reports that he is doing well overall.  Past Medical History:  Diagnosis Date   Arthritis    Hyperlipidemia    Hypertension    Kidney stone    STEMI (ST elevation myocardial infarction) (HCC)    2019   Substance abuse (HCC)    marijuana occasional    Past Surgical History:  Procedure Laterality Date   CORONARY/GRAFT ACUTE MI REVASCULARIZATION N/A 03/29/2018   Procedure: Coronary/Graft Acute MI Revascularization;  Surgeon: Swaziland, Peter M, MD;  Location: [facility] INVASIVE CV LAB;  Service: Cardiovascular;  Laterality: N/A;   HARDWARE REMOVAL Right 11/03/2014   Procedure: HARDWARE REMOVAL from middle finger;  Surgeon: Bradly Bienenstock, MD;  Location: [facility] OR;  Service: Orthopedics;  Laterality: Right;   I & D  EXTREMITY Right 08/23/2014   Procedure: EXPLORATION RIGHT HAND, THUMB INDEX, LONG AND RING FINGER, Index finger amputation, IP Fusion of Long Finger, ORIF of Thumb;  Surgeon: Bradly Bienenstock, MD;  Location: [facility] OR;  Service: Orthopedics;  Laterality: Right;   LEFT HEART CATH AND CORONARY ANGIOGRAPHY N/A 03/29/2018   Procedure: LEFT HEART CATH AND CORONARY ANGIOGRAPHY;  Surgeon: Swaziland, Peter M, MD;  Location: [facility] INVASIVE CV LAB;  Service: Cardiovascular;  Laterality: N/A;   NERVE, TENDON AND ARTERY REPAIR Right 08/23/2014   Procedure: NERVE, TENDON, BONE AND ARTERY REPAIR AS NECESSARY;  Surgeon: Bradly Bienenstock, MD;  Location: [facility] OR;  Service: Orthopedics;  Laterality: Right;   OPEN REDUCTION INTERNAL FIXATION (ORIF) HAND Right 08/23/2014   Procedure: OPEN REDUCTION INTERNAL FIXATION (ORIF) HAND;  Surgeon: Bradly Bienenstock, MD;  Location: [facility] OR;  Service: Orthopedics;  Laterality: Right;    Family History  Problem Relation Age of Onset   Hypertension Father    Colon cancer Neg Hx    Esophageal cancer Neg Hx    Rectal cancer Neg Hx    Stomach cancer Neg Hx     Social History   Socioeconomic History   Marital status: Married    Spouse name: Not on file   Number of children: Not on file   Years of education: Not on file   Highest education level: Not on file  Occupational History   Occupation: roofer/construction  Tobacco Use   Smoking status: Former    Packs/day: 2.50    Years: 38.00    Pack years:  95.00    Types: Cigarettes   Smokeless tobacco: Never   Tobacco comments:    quit 2019  Vaping Use   Vaping Use: Never used  Substance and Sexual Activity   Alcohol use: Yes    Comment: once weekly- liquor   Drug use: Yes    Types: Marijuana    Comment: uses occasionally   Sexual activity: Yes    Birth control/protection: None  Other Topics Concern   Not on file  Social History Narrative   Not on file   Social Determinants of Health   Financial Resource Strain: Not on file  Food  Insecurity: Not on file  Transportation Needs: Not on file  Physical Activity: Not on file  Stress: Not on file  Social Connections: Not on file  Intimate Partner Violence: Not on file    Outpatient Medications Prior to Visit  Medication Sig Dispense Refill   aspirin 81 MG EC tablet Take 1 tablet (81 mg total) by mouth daily. 30 tablet    atorvastatin (LIPITOR) 80 MG tablet TAKE 1 TABLET BY MOTUH DAILY AT 6 PM. 90 tablet 3   carvedilol (COREG) 6.25 MG tablet TAKE 1 TABLET BY MOUTH TWICE DAILY WITH A MEAL. 60 tablet 1   nitroGLYCERIN (NITROSTAT) 0.4 MG SL tablet Place 1 tablet (0.4 mg total) under the tongue every 5 (five) minutes x 3 doses as needed for chest pain. 25 tablet 2   No facility-administered medications prior to visit.    Allergies  Allergen Reactions   Morphine And Related Nausea And Vomiting   Percocet [Oxycodone-Acetaminophen] Itching and Nausea And Vomiting    ROS Review of Systems    Objective:    Physical Exam Constitutional:      Appearance: He is normal weight.  HENT:     Head: Normocephalic.     Nose: Nose normal.     Mouth/Throat:     Mouth: Mucous membranes are moist.  Cardiovascular:     Rate and Rhythm: Normal rate and regular rhythm.     Pulses: Normal pulses.     Heart sounds: Normal heart sounds.  Pulmonary:     Effort: Pulmonary effort is normal.  Abdominal:     General: Bowel sounds are normal.     Palpations: Abdomen is soft.  Musculoskeletal:        General: Normal range of motion.     Cervical back: Normal range of motion.     Right lower leg: No edema.     Left lower leg: No edema.  Skin:    General: Skin is warm and dry.     Capillary Refill: Capillary refill takes less than 2 seconds.  Neurological:     General: No focal deficit present.     Mental Status: He is alert and oriented to person, place, and time.  Psychiatric:        Mood and Affect: Mood normal.        Behavior: Behavior normal.        Thought Content:  Thought content normal.        Judgment: Judgment normal.    BP 124/80   Pulse 65   Temp 98.2 F (36.8 C)   Ht 5\' 6"  (1.676 m)   Wt 161 lb 3.2 oz (73.1 kg)   SpO2 99%   BMI 26.02 kg/m  Wt Readings from Last 3 Encounters:  05/10/21 161 lb 3.2 oz (73.1 kg)  04/08/20 162 lb (73.5 kg)  02/29/20 157 lb (71.2  kg)     There are no preventive care reminders to display for this patient.   There are no preventive care reminders to display for this patient.  Lab Results  Component Value Date   TSH 0.541 03/29/2018   Lab Results  Component Value Date   WBC 10.9 (H) 04/01/2018   HGB 14.2 04/01/2018   HCT 43.5 04/01/2018   MCV 93.1 04/01/2018   PLT 200 04/01/2018   Lab Results  Component Value Date   NA 138 03/01/2020   K 4.5 03/01/2020   CO2 22 03/26/2019   GLUCOSE 118 (H) 03/01/2020   BUN 17 03/01/2020   CREATININE 0.90 03/01/2020   BILITOT 0.3 03/01/2020   ALKPHOS 100 03/01/2020   AST 16 03/01/2020   ALT 15 03/26/2019   PROT 6.3 03/01/2020   ALBUMIN 4.0 03/01/2020   CALCIUM 8.8 03/01/2020   ANIONGAP 14 04/01/2018   Lab Results  Component Value Date   CHOL 113 03/01/2020   Lab Results  Component Value Date   HDL 39 (L) 03/01/2020   Lab Results  Component Value Date   LDLCALC 59 03/01/2020   Lab Results  Component Value Date   TRIG 75 03/01/2020   Lab Results  Component Value Date   CHOLHDL 2.9 03/01/2020   Lab Results  Component Value Date   HGBA1C 5.7 (H) 03/29/2018      Assessment & Plan:   Problem List Items Addressed This Visit       Other   Hyperlipidemia Stable Labs pending we will continue regimen atorvastatin 80 mg Encouraged heart healthy diet and regular exercise   Other Visit Diagnoses     Essential hypertension    -  Primary Stable Encouraged on going compliance with current medication regimen Encouraged home monitoring and recording BP <130/80 Eating a heart-healthy diet with less salt Encouraged regular physical  activity  Continue to follow-up with cardiology as scheduled   Screening for malignant neoplasm of prostate       Relevant Orders   PSA       No orders of the defined types were placed in this encounter.   Follow-up: Return in about 6 months (around 11/08/2021) for Follow up HTN [id number].    Barbette Merino, NP

## 2021-05-10 NOTE — Patient Instructions (Addendum)
Health Maintenance, Male Adopting a healthy lifestyle and getting preventive care are important in promoting health and wellness. Ask your health care provider about: The right schedule for you to have regular tests and exams. Things you can do on your own to prevent diseases and keep yourself healthy. What should I know about diet, weight, and exercise? Eat a healthy diet  Eat a diet that includes plenty of vegetables, fruits, low-fat dairy products, and lean protein. Do not eat a lot of foods that are high in solid fats, added sugars, or sodium. Maintain a healthy weight Body mass index (BMI) is a measurement that can be used to identify possible weight problems. It estimates body fat based on height and weight. Your health care provider can help determine your BMI and help you achieve or maintain a healthy weight. Get regular exercise Get regular exercise. This is one of the most important things you can do for your health. Most adults should: Exercise for at least 150 minutes each week. The exercise should increase your heart rate and make you sweat (moderate-intensity exercise). Do strengthening exercises at least twice a week. This is in addition to the moderate-intensity exercise. Spend less time sitting. Even light physical activity can be beneficial. Watch cholesterol and blood lipids Have your blood tested for lipids and cholesterol at 59 years of age, then have this test every 5 years. You may need to have your cholesterol levels checked more often if: Your lipid or cholesterol levels are high. You are older than 59 years of age. You are at high risk for heart disease. What should I know about cancer screening? Many types of cancers can be detected early and may often be prevented. Depending on your health history and family history, you may need to have cancer screening at various ages. This may include screening for: Colorectal cancer. Prostate cancer. Skin cancer. Lung  cancer. What should I know about heart disease, diabetes, and high blood pressure? Blood pressure and heart disease High blood pressure causes heart disease and increases the risk of stroke. This is more likely to develop in people who have high blood pressure readings, are of African descent, or are overweight. Talk with your health care provider about your target blood pressure readings. Have your blood pressure checked: Every 3-5 years if you are 18-39 years of age. Every year if you are 40 years old or older. If you are between the ages of 65 and 75 and are a current or former smoker, ask your health care provider if you should have a one-time screening for abdominal aortic aneurysm (AAA). Diabetes Have regular diabetes screenings. This checks your fasting blood sugar level. Have the screening done: Once every three years after age 45 if you are at a normal weight and have a low risk for diabetes. More often and at a younger age if you are overweight or have a high risk for diabetes. What should I know about preventing infection? Hepatitis B If you have a higher risk for hepatitis B, you should be screened for this virus. Talk with your health care provider to find out if you are at risk for hepatitis B infection. Hepatitis C Blood testing is recommended for: Everyone born from 1945 through 1965. Anyone with known risk factors for hepatitis C. Sexually transmitted infections (STIs) You should be screened each year for STIs, including gonorrhea and chlamydia, if: You are sexually active and are younger than 59 years of age. You are older than 59 years   of age and your health care provider tells you that you are at risk for this type of infection. Your sexual activity has changed since you were last screened, and you are at increased risk for chlamydia or gonorrhea. Ask your health care provider if you are at risk. Ask your health care provider about whether you are at high risk for HIV.  Your health care provider may recommend a prescription medicine to help prevent HIV infection. If you choose to take medicine to prevent HIV, you should first get tested for HIV. You should then be tested every 3 months for as long as you are taking the medicine. Follow these instructions at home: Lifestyle Do not use any products that contain nicotine or tobacco, such as cigarettes, e-cigarettes, and chewing tobacco. If you need help quitting, ask your health care provider. Do not use street drugs. Do not share needles. Ask your health care provider for help if you need support or information about quitting drugs. Alcohol use Do not drink alcohol if your health care provider tells you not to drink. If you drink alcohol: Limit how much you have to 0-2 drinks a day. Be aware of how much alcohol is in your drink. In the [place] one drink equals one 12 oz bottle of beer (355 mL), one 5 oz glass of wine (148 mL), or one 1 oz glass of hard liquor (44 mL). General instructions Schedule regular health, dental, and eye exams. Stay current with your vaccines. Tell your health care provider if: You often feel depressed. You have ever been abused or do not feel safe at home. Summary Adopting a healthy lifestyle and getting preventive care are important in promoting health and wellness. Follow your health care provider's instructions about healthy diet, exercising, and getting tested or screened for diseases. Follow your health care provider's instructions on monitoring your cholesterol and blood pressure. This information is not intended to replace advice given to you by your health care provider. Make sure you discuss any questions you have with your health care provider. Document Revised: 09/09/2020 Document Reviewed: 06/25/2018 Elsevier Patient Education  2022 Elsevier Inc.   Managing Your Hypertension Hypertension, also called high blood pressure, is when the force of the blood pressing against  the walls of the arteries is too strong. Arteries are blood vessels that carry blood from your heart throughout your body. Hypertension forces the heart to work harder to pump blood and may cause the arteries to become narrow or stiff. Understanding blood pressure readings Your personal target blood pressure may vary depending on your medical conditions, your age, and other factors. A blood pressure reading includes a higher number over a lower number. Ideally, your blood pressure should be below 120/80. You should know that: The first, or top, number is called the systolic pressure. It is a measure of the pressure in your arteries as your heart beats. The second, or bottom number, is called the diastolic pressure. It is a measure of the pressure in your arteries as the heart relaxes. Blood pressure is classified into four stages. Based on your blood pressure reading, your health care provider may use the following stages to determine what type of treatment you need, if any. Systolic pressure and diastolic pressure are measured in a unit called mmHg. Normal Systolic pressure: below 120. Diastolic pressure: below 80. Elevated Systolic pressure: 120-129. Diastolic pressure: below 80. Hypertension stage 1 Systolic pressure: 130-139. Diastolic pressure: 80-89. Hypertension stage 2 Systolic pressure: 140 or above. Diastolic pressure:  90 or above. How can this condition affect me? Managing your hypertension is an important responsibility. Over time, hypertension can damage the arteries and decrease blood flow to important parts of the body, including the brain, heart, and kidneys. Having untreated or uncontrolled hypertension can lead to: A heart attack. A stroke. A weakened blood vessel (aneurysm). Heart failure. Kidney damage. Eye damage. Metabolic syndrome. Memory and concentration problems. Vascular dementia. What actions can I take to manage this condition? Hypertension can be managed by  making lifestyle changes and possibly by taking medicines. Your health care provider will help you make a plan to bring your blood pressure within a normal range. Nutrition  Eat a diet that is high in fiber and potassium, and low in salt (sodium), added sugar, and fat. An example eating plan is called the Dietary Approaches to Stop Hypertension (DASH) diet. To eat this way: Eat plenty of fresh fruits and vegetables. Try to fill one-half of your plate at each meal with fruits and vegetables. Eat whole grains, such as whole-wheat pasta, brown rice, or whole-grain bread. Fill about one-fourth of your plate with whole grains. Eat low-fat dairy products. Avoid fatty cuts of meat, processed or cured meats, and poultry with skin. Fill about one-fourth of your plate with lean proteins such as fish, chicken without skin, beans, eggs, and tofu. Avoid pre-made and processed foods. These tend to be higher in sodium, added sugar, and fat. Reduce your daily sodium intake. Most people with hypertension should eat less than 1,500 mg of sodium a day. Lifestyle  Work with your health care provider to maintain a healthy body weight or to lose weight. Ask what an ideal weight is for you. Get at least 30 minutes of exercise that causes your heart to beat faster (aerobic exercise) most days of the week. Activities may include walking, swimming, or biking. Include exercise to strengthen your muscles (resistance exercise), such as weight lifting, as part of your weekly exercise routine. Try to do these types of exercises for 30 minutes at least 3 days a week. Do not use any products that contain nicotine or tobacco, such as cigarettes, e-cigarettes, and chewing tobacco. If you need help quitting, ask your health care provider. Control any long-term (chronic) conditions you have, such as high cholesterol or diabetes. Identify your sources of stress and find ways to manage stress. This may include meditation, deep breathing,  or making time for fun activities. Alcohol use Do not drink alcohol if: Your health care provider tells you not to drink. You are pregnant, may be pregnant, or are planning to become pregnant. If you drink alcohol: Limit how much you use to: 0-1 drink a day for women. 0-2 drinks a day for men. Be aware of how much alcohol is in your drink. In the [place] one drink equals one 12 oz bottle of beer (355 mL), one 5 oz glass of wine (148 mL), or one 1 oz glass of hard liquor (44 mL). Medicines Your health care provider may prescribe medicine if lifestyle changes are not enough to get your blood pressure under control and if: Your systolic blood pressure is 130 or higher. Your diastolic blood pressure is 80 or higher. Take medicines only as told by your health care provider. Follow the directions carefully. Blood pressure medicines must be taken as told by your health care provider. The medicine does not work as well when you skip doses. Skipping doses also puts you at risk for problems. Monitoring Before you monitor  your blood pressure: Do not smoke, drink caffeinated beverages, or exercise within 30 minutes before taking a measurement. Use the bathroom and empty your bladder (urinate). Sit quietly for at least 5 minutes before taking measurements. Monitor your blood pressure at home as told by your health care provider. To do this: Sit with your back straight and supported. Place your feet flat on the floor. Do not cross your legs. Support your arm on a flat surface, such as a table. Make sure your upper arm is at heart level. Each time you measure, take two or three readings one minute apart and record the results. You may also need to have your blood pressure checked regularly by your health care provider. General information Talk with your health care provider about your diet, exercise habits, and other lifestyle factors that may be contributing to hypertension. Review all the medicines you  take with your health care provider because there may be side effects or interactions. Keep all visits as told by your health care provider. Your health care provider can help you create and adjust your plan for managing your high blood pressure. Where to find more information National Heart, Lung, and Blood Institute: [employer].is American Heart Association: www.heart.org Contact a health care provider if: You think you are having a reaction to medicines you have taken. You have repeated (recurrent) headaches. You feel dizzy. You have swelling in your ankles. You have trouble with your vision. Get help right away if: You develop a severe headache or confusion. You have unusual weakness or numbness, or you feel faint. You have severe pain in your chest or abdomen. You vomit repeatedly. You have trouble breathing. These symptoms may represent a serious problem that is an emergency. Do not wait to see if the symptoms will go away. Get medical help right away. Call your local emergency services (911 in the [place] Do not drive yourself to the hospital. Summary Hypertension is when the force of blood pumping through your arteries is too strong. If this condition is not controlled, it may put you at risk for serious complications. Your personal target blood pressure may vary depending on your medical conditions, your age, and other factors. For most people, a normal blood pressure is less than 120/80. Hypertension is managed by lifestyle changes, medicines, or both. Lifestyle changes to help manage hypertension include losing weight, eating a healthy, low-sodium diet, exercising more, stopping smoking, and limiting alcohol. This information is not intended to replace advice given to you by your health care provider. Make sure you discuss any questions you have with your health care provider. Document Revised: 08/07/2019 Document Reviewed: 06/02/2019 Elsevier Patient Education  2022 [employer].

## 2021-05-11 LAB — PSA: Prostate Specific Ag, Serum: 1 ng/mL (ref 0.0–4.0)

## 2021-05-30 ENCOUNTER — Other Ambulatory Visit: Payer: Self-pay

## 2021-05-30 ENCOUNTER — Other Ambulatory Visit: Payer: Self-pay | Admitting: Nurse Practitioner

## 2021-05-30 DIAGNOSIS — I10 Essential (primary) hypertension: Secondary | ICD-10-CM

## 2021-05-30 MED ORDER — CARVEDILOL 6.25 MG PO TABS
ORAL_TABLET | Freq: Two times a day (BID) | ORAL | 1 refills | Status: DC
Start: 2021-05-30 — End: 2021-06-30
  Filled 2021-05-30: qty 60, 30d supply, fill #0

## 2021-05-30 MED FILL — Atorvastatin Calcium Tab 80 MG (Base Equivalent): ORAL | 30 days supply | Qty: 30 | Fill #6 | Status: AC

## 2021-06-26 NOTE — Progress Notes (Signed)
Cardiology Office Note:    Date:  06/30/2021   ID:  [name], DOB [birth year], MRN [id number]  PCP:  Barbette Merino, NP  Cardiologist:  Adra Shepler [place], MD   Referring MD: Barbette Merino, NP   Chief Complaint  Patient presents with   Coronary Artery Disease     History of Present Illness:    Robert Benson is a 60 y.o. male seen for follow up CAD. He presented in September 2019 with an acute inferior STEMI. He was taken emergently to the Cath Lab.  Heart cath revealed RCA with a heavy thrombus burden treated with aspiration thrombectomy and DES.  LV gram with inferior wall motion abnormality and LVEF of 45-50%, confirmed with echocardiogram.  Grade 1 diastolic dysfunction.   Post intervention, he had nonsustained ventricular tachycardia.    On follow up today he is doing well. He has not resumed smoking since his heart attack. He denies chest pain, SOB, DOE, orthopnea, and lower extremity swelling. He is compliant on medications. He does a lot of yard work. Has gained 3 lbs since last year.    Past Medical History:  Diagnosis Date   Arthritis    Hyperlipidemia    Hypertension    Kidney stone    STEMI (ST elevation myocardial infarction) (HCC)    2019   Substance abuse (HCC)    marijuana occasional    Past Surgical History:  Procedure Laterality Date   CORONARY/GRAFT ACUTE MI REVASCULARIZATION N/A 03/29/2018   Procedure: Coronary/Graft Acute MI Revascularization;  Surgeon: [place], Tienna Bienkowski M, MD;  Location: [facility] INVASIVE CV LAB;  Service: Cardiovascular;  Laterality: N/A;   HARDWARE REMOVAL Right 11/03/2014   Procedure: HARDWARE REMOVAL from middle finger;  Surgeon: Bradly Bienenstock, MD;  Location: [facility] OR;  Service: Orthopedics;  Laterality: Right;   I & D EXTREMITY Right 08/23/2014   Procedure: EXPLORATION RIGHT HAND, THUMB INDEX, LONG AND RING FINGER, Index finger amputation, IP Fusion of Long Finger, ORIF of Thumb;  Surgeon: Bradly Bienenstock, MD;  Location: [facility] OR;  Service: Orthopedics;   Laterality: Right;   LEFT HEART CATH AND CORONARY ANGIOGRAPHY N/A 03/29/2018   Procedure: LEFT HEART CATH AND CORONARY ANGIOGRAPHY;  Surgeon: [place], Nyanna Heideman M, MD;  Location: [facility] INVASIVE CV LAB;  Service: Cardiovascular;  Laterality: N/A;   NERVE, TENDON AND ARTERY REPAIR Right 08/23/2014   Procedure: NERVE, TENDON, BONE AND ARTERY REPAIR AS NECESSARY;  Surgeon: Bradly Bienenstock, MD;  Location: [facility] OR;  Service: Orthopedics;  Laterality: Right;   OPEN REDUCTION INTERNAL FIXATION (ORIF) HAND Right 08/23/2014   Procedure: OPEN REDUCTION INTERNAL FIXATION (ORIF) HAND;  Surgeon: Bradly Bienenstock, MD;  Location: [facility] OR;  Service: Orthopedics;  Laterality: Right;    Current Medications: Current Meds  Medication Sig   aspirin 81 MG EC tablet Take 1 tablet (81 mg total) by mouth daily.   atorvastatin (LIPITOR) 80 MG tablet TAKE 1 TABLET BY MOTUH DAILY AT 6 PM.   carvedilol (COREG) 6.25 MG tablet TAKE 1 TABLET BY MOUTH TWICE DAILY WITH A MEAL.   nitroGLYCERIN (NITROSTAT) 0.4 MG SL tablet Place 1 tablet (0.4 mg total) under the tongue every 5 (five) minutes x 3 doses as needed for chest pain.     Allergies:   Morphine and related and Percocet [oxycodone-acetaminophen]   Social History   Socioeconomic History   Marital status: Married    Spouse name: Not on file   Number of children: Not on file   Years of education: Not  on file   Highest education level: Not on file  Occupational History   Occupation: roofer/construction  Tobacco Use   Smoking status: Former    Packs/day: 2.50    Years: 38.00    Pack years: 95.00    Types: Cigarettes   Smokeless tobacco: Never   Tobacco comments:    quit 2019  Vaping Use   Vaping Use: Never used  Substance and Sexual Activity   Alcohol use: Yes    Comment: once weekly- liquor   Drug use: Yes    Types: Marijuana    Comment: uses occasionally   Sexual activity: Yes    Birth control/protection: None  Other Topics Concern   Not on file  Social History Narrative    Not on file   Social Determinants of Health   Financial Resource Strain: Not on file  Food Insecurity: Not on file  Transportation Needs: Not on file  Physical Activity: Not on file  Stress: Not on file  Social Connections: Not on file     Family History: The patient's family history includes Hypertension in his father. There is no history of Colon cancer, Esophageal cancer, Rectal cancer, or Stomach cancer.  ROS:   Please see the history of present illness.    All other systems reviewed and are negative.  EKGs/Labs/Other Studies Reviewed:    The following studies were reviewed today:  Cath: 03/29/18   Prox RCA lesion is 99% stenosed. Post intervention, there is a 0% residual stenosis. A drug-eluting stent was successfully placed using a STENT SYNERGY DES 3.5X16. There is mild left ventricular systolic dysfunction. LV end diastolic pressure is normal. The left ventricular ejection fraction is 45-50% by visual estimate.   1. Single vessel occlusive CAD involving the proximal RCA with large thrombus burden. 2. Mild LV dysfunction with inferior wall motion abnormality 3. Normal LVEDP 4. Successful PCI of the RCA with aspiration thrombectomy and stenting with DES x 1.   Plan: DAPT for one year. IV Aggrastat for 18 hours. High dose statin. May be a candidate for fast track DC if no complications.    Recommend uninterrupted dual antiplatelet therapy with Aspirin 81mg  daily and Ticagrelor 90mg  twice daily for a minimum of 12 months (ACS - Class I recommendation).    TTE: 03/31/18  Study Conclusions  - Left ventricle: The cavity size was normal. Systolic function was   mildly reduced. The estimated ejection fraction was in the range   of 45% to 50%. There is akinesis of the basal-midinferoseptal   myocardium. Doppler parameters are consistent with abnormal left   ventricular relaxation (grade 1 diastolic dysfunction). - Mitral valve: There was moderate regurgitation  Echo  12/02/18: IMPRESSIONS      1. The left ventricle has mildly reduced systolic function, with an ejection fraction of 45-50%. The cavity size was normal. Left ventricular diastolic parameters were normal.  2. The right ventricle has normal systolic function. The cavity was normal.  3. The mitral valve is grossly normal.  4. The tricuspid valve is grossly normal.  5. The aortic valve is tricuspid. Mild thickening of the aortic valve. No stenosis of the aortic valve.  6. Severe hypokinesis of the basal inferior and inferolateral walls; overall mild LV dysfunction; mild MR.   EKG:  EKG is  ordered today.  NSR rate 67. Normal. I have personally reviewed and interpreted this study.   Recent Labs: No results found for requested labs within last 8760 hours.  Recent Lipid Panel  Component Value Date/Time   CHOL 113 03/01/2020 1007   TRIG 75 03/01/2020 1007   HDL 39 (L) 03/01/2020 1007   CHOLHDL 2.9 03/01/2020 1007   CHOLHDL 3.4 03/30/2018 0233   VLDL 10 03/30/2018 0233   LDLCALC 59 03/01/2020 1007    Physical Exam:    VS:  BP 120/84   Pulse 67   Ht 5\' 7"  (1.702 m)   Wt 160 lb 6.4 oz (72.8 kg)   SpO2 98%   BMI 25.12 kg/m     Wt Readings from Last 3 Encounters:  06/30/21 160 lb 6.4 oz (72.8 kg)  05/10/21 161 lb 3.2 oz (73.1 kg)  04/08/20 162 lb (73.5 kg)     GEN: Well nourished, well developed in no acute distress HEENT: Normal NECK: No JVD; No carotid bruits CARDIAC: RRR, no murmurs, rubs, gallops RESPIRATORY:  Clear to auscultation without rales, wheezing or rhonchi  ABDOMEN: Soft, non-tender, non-distended MUSCULOSKELETAL:  No edema; No deformity right radial C/D/I SKIN: Warm and dry NEUROLOGIC:  Alert and oriented x 3 PSYCHIATRIC:  Normal affect   ASSESSMENT:    1. Coronary artery disease involving native coronary artery of native heart without angina pectoris   2. Essential hypertension   3. Hypercholesterolemia   4. Essential hypertension   5. STEMI involving  right coronary artery (HCC)     PLAN:    In order of problems listed above:  1. Coronary artery disease involving native coronary artery of native heart without angina pectoris  STEMI s/p thrombectomy and DES to RCA- September 14,2019  He is asymptomatic. Continue ASA, statin, and Coreg.    2. Essential Hypertension Well controlled on Coreg.    3. Hyperlipidemia, unspecified hyperlipidemia type - on high dose statin. Will update labs today.    4. Tobacco abuse He has quit smoking cigarettes 2 years ago.   Follow up in one year.  Medication Adjustments/Labs and Tests Ordered: Current medicines are reviewed at length with the patient today.  Concerns regarding medicines are outlined above.  No orders of the defined types were placed in this encounter.  No orders of the defined types were placed in this encounter.   Signed, Lyndsey Demos September 16,2019, MD  06/30/2021 9:44 AM    [facility]

## 2021-06-30 ENCOUNTER — Ambulatory Visit (INDEPENDENT_AMBULATORY_CARE_PROVIDER_SITE_OTHER): Payer: Self-pay | Admitting: Cardiology

## 2021-06-30 ENCOUNTER — Other Ambulatory Visit: Payer: Self-pay

## 2021-06-30 ENCOUNTER — Encounter: Payer: Self-pay | Admitting: Cardiology

## 2021-06-30 VITALS — BP 120/84 | HR 67 | Ht 67.0 in | Wt 160.4 lb

## 2021-06-30 DIAGNOSIS — I10 Essential (primary) hypertension: Secondary | ICD-10-CM

## 2021-06-30 DIAGNOSIS — I2111 ST elevation (STEMI) myocardial infarction involving right coronary artery: Secondary | ICD-10-CM

## 2021-06-30 DIAGNOSIS — I2510 Atherosclerotic heart disease of native coronary artery without angina pectoris: Secondary | ICD-10-CM

## 2021-06-30 DIAGNOSIS — E7800 Pure hypercholesterolemia, unspecified: Secondary | ICD-10-CM

## 2021-06-30 LAB — BASIC METABOLIC PANEL
BUN/Creatinine Ratio: 18 (ref 9–20)
BUN: 16 mg/dL (ref 6–24)
CO2: 23 mmol/L (ref 20–29)
Calcium: 9.2 mg/dL (ref 8.7–10.2)
Chloride: 99 mmol/L (ref 96–106)
Creatinine, Ser: 0.91 mg/dL (ref 0.76–1.27)
Glucose: 72 mg/dL (ref 70–99)
Potassium: 4.5 mmol/L (ref 3.5–5.2)
Sodium: 136 mmol/L (ref 134–144)
eGFR: 98 mL/min/{1.73_m2} (ref >59)

## 2021-06-30 LAB — HEPATIC FUNCTION PANEL
ALT: 24 IU/L (ref 0–44)
AST: 19 IU/L (ref 0–40)
Albumin: 4.5 g/dL (ref 3.8–4.9)
Alkaline Phosphatase: 124 IU/L — ABNORMAL HIGH (ref 44–121)
Bilirubin Total: 0.6 mg/dL (ref 0.0–1.2)
Bilirubin, Direct: 0.17 mg/dL (ref 0.00–0.40)
Total Protein: 6.7 g/dL (ref 6.0–8.5)

## 2021-06-30 LAB — LIPID PANEL
Chol/HDL Ratio: 3.3 ratio (ref 0.0–5.0)
Cholesterol, Total: 130 mg/dL (ref 100–199)
HDL: 40 mg/dL (ref >39)
LDL Chol Calc (NIH): 70 mg/dL (ref 0–99)
Triglycerides: 109 mg/dL (ref 0–149)
VLDL Cholesterol Cal: 20 mg/dL (ref 5–40)

## 2021-06-30 MED ORDER — CARVEDILOL 6.25 MG PO TABS
ORAL_TABLET | Freq: Two times a day (BID) | ORAL | 3 refills | Status: DC
  Filled 2021-06-30 – 2021-08-02 (×2): qty 60, 30d supply, fill #0
  Filled 2021-09-05: qty 60, 30d supply, fill #1
  Filled 2021-10-06: qty 60, 30d supply, fill #2
  Filled 2021-11-06: qty 60, 30d supply, fill #3
  Filled 2021-12-08: qty 60, 30d supply, fill #4
  Filled 2022-01-17: qty 60, 30d supply, fill #5
  Filled 2022-02-13: qty 60, 30d supply, fill #6
  Filled 2022-03-20: qty 60, 30d supply, fill #7
  Filled 2022-04-23: qty 60, 30d supply, fill #8
  Filled 2022-05-28: qty 60, 30d supply, fill #9

## 2021-06-30 MED ORDER — ATORVASTATIN CALCIUM 80 MG PO TABS
80.0000 mg | ORAL_TABLET | Freq: Every day | ORAL | 3 refills | Status: DC
  Filled 2021-06-30 – 2021-08-02 (×2): qty 30, 30d supply, fill #0
  Filled 2021-09-05: qty 30, 30d supply, fill #1
  Filled 2021-10-06: qty 30, 30d supply, fill #2
  Filled 2021-11-06: qty 30, 30d supply, fill #3
  Filled 2021-12-08: qty 30, 30d supply, fill #4
  Filled 2022-01-17: qty 30, 30d supply, fill #5
  Filled 2022-02-13: qty 30, 30d supply, fill #6
  Filled 2022-03-20: qty 30, 30d supply, fill #7
  Filled 2022-04-23: qty 30, 30d supply, fill #8
  Filled 2022-05-28: qty 30, 30d supply, fill #9

## 2021-06-30 NOTE — Addendum Note (Signed)
Addended by: Neoma Laming on: 06/30/2021 09:48 AM   Modules accepted: Orders

## 2021-07-03 ENCOUNTER — Other Ambulatory Visit: Payer: Self-pay

## 2021-08-02 ENCOUNTER — Other Ambulatory Visit: Payer: Self-pay

## 2021-08-04 ENCOUNTER — Other Ambulatory Visit: Payer: Self-pay

## 2021-08-07 ENCOUNTER — Other Ambulatory Visit: Payer: Self-pay

## 2021-09-05 ENCOUNTER — Other Ambulatory Visit: Payer: Self-pay

## 2021-10-06 ENCOUNTER — Other Ambulatory Visit: Payer: Self-pay

## 2021-11-06 ENCOUNTER — Other Ambulatory Visit: Payer: Self-pay

## 2021-11-08 ENCOUNTER — Encounter: Payer: Self-pay | Admitting: Nurse Practitioner

## 2021-11-08 ENCOUNTER — Ambulatory Visit (INDEPENDENT_AMBULATORY_CARE_PROVIDER_SITE_OTHER): Payer: Self-pay | Admitting: Nurse Practitioner

## 2021-11-08 ENCOUNTER — Ambulatory Visit: Payer: Medicaid Other | Admitting: Nurse Practitioner

## 2021-11-08 VITALS — BP 107/81 | HR 77 | Temp 98.4°F | Ht 66.0 in | Wt 169.2 lb

## 2021-11-08 DIAGNOSIS — I10 Essential (primary) hypertension: Secondary | ICD-10-CM

## 2021-11-08 NOTE — Progress Notes (Signed)
@Patient  ID: , male    DOB: 1961/04/09, 61 y.o.   MRN: [id number] ? ?Chief Complaint  ?Patient presents with  ? Follow-up  ?  Pt is here for 6 months BP follow up. No issues or concerns  ? ? ?Referring provider: ?[id number], NP ? ? ?HPI ? ? ?IZACC DEMEYER presents for follow up. He  has a past medical history of Arthritis, Hyperlipidemia, Hypertension, Kidney stone, STEMI (ST elevation myocardial infarction) (HCC), and Substance abuse (HCC).  ? ?Subjective:  ?DONLEY HARLAND is a 61 y.o. male with hypertension. ?Current Outpatient Medications  ?Medication Sig Dispense Refill  ? aspirin 81 MG EC tablet Take 1 tablet (81 mg total) by mouth daily. 30 tablet   ? atorvastatin (LIPITOR) 80 MG tablet Take 1 tablet (80 mg total) by mouth daily. 90 tablet 3  ? carvedilol (COREG) 6.25 MG tablet TAKE 1 TABLET BY MOUTH TWICE DAILY WITH A MEAL. 180 tablet 3  ? nitroGLYCERIN (NITROSTAT) 0.4 MG SL tablet Place 1 tablet (0.4 mg total) under the tongue every 5 (five) minutes x 3 doses as needed for chest pain. 25 tablet 2  ? ?No current facility-administered medications for this visit.  ?  ?Hypertension ROS: taking medications as instructed, no medication side effects noted, no TIA's, no chest pain on exertion, no dyspnea on exertion, no swelling of ankles, and no orthostatic dizziness or lightheadedness. Denies f/c/s, n/v/d, hemoptysis, PND, chest pain or edema. Cardiovascular risk factors: advanced age (older than 98 for men, 53 for women), dyslipidemia, hypertension, and male gender. Use of agents associated with hypertension: none. History of target organ damage: angina/ prior myocardial infarction. He works a little. Overall patient doing well. ? ? ? ?Allergies  ?Allergen Reactions  ? Morphine And Related Nausea And Vomiting  ? Percocet [Oxycodone-Acetaminophen] Itching and Nausea And Vomiting  ? ? ?Immunization History  ?Administered Date(s) Administered  ? Tdap 08/23/2014  ? ? ?Past Medical History:  ?Diagnosis  Date  ? Arthritis   ? Hyperlipidemia   ? Hypertension   ? Kidney stone   ? STEMI (ST elevation myocardial infarction) (HCC)   ? 2019  ? Substance abuse (HCC)   ? marijuana occasional  ? ? ?Tobacco History: ?Social History  ? ?Tobacco Use  ?Smoking Status Former  ? Packs/day: 2.50  ? Years: 38.00  ? Pack years: 95.00  ? Types: Cigarettes  ?Smokeless Tobacco Never  ?Tobacco Comments  ? quit 2019  ? ?Counseling given: Not Answered ?Tobacco comments: quit 2019 ? ? ?Outpatient Encounter Medications as of 11/08/2021  ?Medication Sig  ? aspirin 81 MG EC tablet Take 1 tablet (81 mg total) by mouth daily.  ? atorvastatin (LIPITOR) 80 MG tablet Take 1 tablet (80 mg total) by mouth daily.  ? carvedilol (COREG) 6.25 MG tablet TAKE 1 TABLET BY MOUTH TWICE DAILY WITH A MEAL.  ? nitroGLYCERIN (NITROSTAT) 0.4 MG SL tablet Place 1 tablet (0.4 mg total) under the tongue every 5 (five) minutes x 3 doses as needed for chest pain.  ? ?No facility-administered encounter medications on file as of 11/08/2021.  ? ? ? ?Review of Systems ? ?Review of Systems  ?Constitutional: Negative.   ?HENT: Negative.    ?Cardiovascular: Negative.   ?Gastrointestinal: Negative.   ?Allergic/Immunologic: Negative.   ?Neurological: Negative.   ?Psychiatric/Behavioral: Negative.     ? ? ? ?Physical Exam ? ?BP 107/81 (BP Location: Right Arm, Patient Position: Sitting, Cuff Size: Normal)   Pulse 77  Temp 98.4 ?F (36.9 ?C)   Ht 5\' 6"  (1.676 m)   Wt 169 lb 4 oz (76.8 kg)   SpO2 98%   BMI 27.32 kg/m?  ? ?Wt Readings from Last 5 Encounters:  ?11/08/21 169 lb 4 oz (76.8 kg)  ?06/30/21 160 lb 6.4 oz (72.8 kg)  ?05/10/21 161 lb 3.2 oz (73.1 kg)  ?04/08/20 162 lb (73.5 kg)  ?02/29/20 157 lb (71.2 kg)  ? ? ? ?Physical Exam ?Vitals and nursing note reviewed.  ?Constitutional:   ?   General: He is not in acute distress. ?   Appearance: He is well-developed.  ?Cardiovascular:  ?   Rate and Rhythm: Normal rate and regular rhythm.  ?Pulmonary:  ?   Effort: Pulmonary  effort is normal.  ?   Breath sounds: Normal breath sounds.  ?Skin: ?   General: Skin is warm and dry.  ?Neurological:  ?   Mental Status: He is alert and oriented to person, place, and time.  ? ? ? ?Lab Results: ? ?CBC ?   ?Component Value Date/Time  ? WBC 10.9 (H) 04/01/2018 0252  ? RBC 4.67 04/01/2018 0252  ? HGB 14.2 04/01/2018 0252  ? HCT 43.5 04/01/2018 0252  ? PLT 200 04/01/2018 0252  ? MCV 93.1 04/01/2018 0252  ? MCH 30.4 04/01/2018 0252  ? MCHC 32.6 04/01/2018 0252  ? RDW 12.5 04/01/2018 0252  ? LYMPHSABS 1.3 03/29/2018 0645  ? MONOABS 0.9 03/29/2018 0645  ? EOSABS 0.0 03/29/2018 0645  ? BASOSABS 0.0 03/29/2018 0645  ? ? ?BMET ?   ?Component Value Date/Time  ? NA 136 06/30/2021 1010  ? K 4.5 06/30/2021 1010  ? CL 99 06/30/2021 1010  ? CO2 23 06/30/2021 1010  ? GLUCOSE 72 06/30/2021 1010  ? GLUCOSE 100 (H) 04/01/2018 0252  ? BUN 16 06/30/2021 1010  ? CREATININE 0.91 06/30/2021 1010  ? CALCIUM 9.2 06/30/2021 1010  ? GFRNONAA 94 03/01/2020 1007  ? GFRAA 109 03/01/2020 1007  ? ? ? ?Assessment & Plan:  ? ?Hypertension ?- Basic Metabolic Panel ? ?Follow up: ? ?Follow up in 6 months or sooner if needed ? ?Patient Instructions  ?1. Essential hypertension ? ?- Basic Metabolic Panel ? ?Follow up: ? ?Follow up in 6 months or sooner if needed ? ?DASH Eating Plan ?DASH stands for Dietary Approaches to Stop Hypertension. The DASH eating plan is a healthy eating plan that has been shown to: ?Reduce high blood pressure (hypertension). ?Reduce your risk for type 2 diabetes, heart disease, and stroke. ?Help with weight loss. ?What are tips for following this plan? ?Reading food labels ?Check food labels for the amount of salt (sodium) per serving. Choose foods with less than 5 percent of the Daily Value of sodium. Generally, foods with less than 300 milligrams (mg) of sodium per serving fit into this eating plan. ?To find whole grains, look for the word "whole" as the first word in the ingredient list. ?Shopping ?Buy  products labeled as "low-sodium" or "no salt added." ?Buy fresh foods. Avoid canned foods and pre-made or frozen meals. ?Cooking ?Avoid adding salt when cooking. Use salt-free seasonings or herbs instead of table salt or sea salt. Check with your health care provider or pharmacist before using salt substitutes. ?Do not fry foods. Cook foods using healthy methods such as baking, boiling, grilling, roasting, and broiling instead. ?Cook with heart-healthy oils, such as olive, canola, avocado, soybean, or sunflower oil. ?Meal planning ? ?Eat a balanced diet that includes: ?4 or  more servings of fruits and 4 or more servings of vegetables each day. Try to fill one-half of your plate with fruits and vegetables. ?6-8 servings of whole grains each day. ?Less than 6 oz (170 g) of lean meat, poultry, or fish each day. A 3-oz (85-g) serving of meat is about the same size as a deck of cards. One egg equals 1 oz (28 g). ?2-3 servings of low-fat dairy each day. One serving is 1 cup (237 mL). ?1 serving of nuts, seeds, or beans 5 times each week. ?2-3 servings of heart-healthy fats. Healthy fats called omega-3 fatty acids are found in foods such as walnuts, flaxseeds, fortified milks, and eggs. These fats are also found in cold-water fish, such as sardines, salmon, and mackerel. ?Limit how much you eat of: ?Canned or prepackaged foods. ?Food that is high in trans fat, such as some fried foods. ?Food that is high in saturated fat, such as fatty meat. ?Desserts and other sweets, sugary drinks, and other foods with added sugar. ?Full-fat dairy products. ?Do not salt foods before eating. ?Do not eat more than 4 egg yolks a week. ?Try to eat at least 2 vegetarian meals a week. ?Eat more home-cooked food and less restaurant, buffet, and fast food. ?Lifestyle ?When eating at a restaurant, ask that your food be prepared with less salt or no salt, if possible. ?If you drink alcohol: ?Limit how much you use to: ?0-1 drink a day for women  who are not pregnant. ?0-2 drinks a day for men. ?Be aware of how much alcohol is in your drink. In the [place] one drink equals one 12 oz bottle of beer (355 mL), one 5 oz glass of wine (148 mL), or one 1? oz glass of hard

## 2021-11-08 NOTE — Patient Instructions (Signed)
1. Essential hypertension ? ?- Basic Metabolic Panel ? ?Follow up: ? ?Follow up in 6 months or sooner if needed ? ?DASH Eating Plan ?DASH stands for Dietary Approaches to Stop Hypertension. The DASH eating plan is a healthy eating plan that has been shown to: ?Reduce high blood pressure (hypertension). ?Reduce your risk for type 2 diabetes, heart disease, and stroke. ?Help with weight loss. ?What are tips for following this plan? ?Reading food labels ?Check food labels for the amount of salt (sodium) per serving. Choose foods with less than 5 percent of the Daily Value of sodium. Generally, foods with less than 300 milligrams (mg) of sodium per serving fit into this eating plan. ?To find whole grains, look for the word "whole" as the first word in the ingredient list. ?Shopping ?Buy products labeled as "low-sodium" or "no salt added." ?Buy fresh foods. Avoid canned foods and pre-made or frozen meals. ?Cooking ?Avoid adding salt when cooking. Use salt-free seasonings or herbs instead of table salt or sea salt. Check with your health care provider or pharmacist before using salt substitutes. ?Do not fry foods. Cook foods using healthy methods such as baking, boiling, grilling, roasting, and broiling instead. ?Cook with heart-healthy oils, such as olive, canola, avocado, soybean, or sunflower oil. ?Meal planning ? ?Eat a balanced diet that includes: ?4 or more servings of fruits and 4 or more servings of vegetables each day. Try to fill one-half of your plate with fruits and vegetables. ?6-8 servings of whole grains each day. ?Less than 6 oz (170 g) of lean meat, poultry, or fish each day. A 3-oz (85-g) serving of meat is about the same size as a deck of cards. One egg equals 1 oz (28 g). ?2-3 servings of low-fat dairy each day. One serving is 1 cup (237 mL). ?1 serving of nuts, seeds, or beans 5 times each week. ?2-3 servings of heart-healthy fats. Healthy fats called omega-3 fatty acids are found in foods such as  walnuts, flaxseeds, fortified milks, and eggs. These fats are also found in cold-water fish, such as sardines, salmon, and mackerel. ?Limit how much you eat of: ?Canned or prepackaged foods. ?Food that is high in trans fat, such as some fried foods. ?Food that is high in saturated fat, such as fatty meat. ?Desserts and other sweets, sugary drinks, and other foods with added sugar. ?Full-fat dairy products. ?Do not salt foods before eating. ?Do not eat more than 4 egg yolks a week. ?Try to eat at least 2 vegetarian meals a week. ?Eat more home-cooked food and less restaurant, buffet, and fast food. ?Lifestyle ?When eating at a restaurant, ask that your food be prepared with less salt or no salt, if possible. ?If you drink alcohol: ?Limit how much you use to: ?0-1 drink a day for women who are not pregnant. ?0-2 drinks a day for men. ?Be aware of how much alcohol is in your drink. In the [place] one drink equals one 12 oz bottle of beer (355 mL), one 5 oz glass of wine (148 mL), or one 1? oz glass of hard liquor (44 mL). ?General information ?Avoid eating more than 2,300 mg of salt a day. If you have hypertension, you may need to reduce your sodium intake to 1,500 mg a day. ?Work with your health care provider to maintain a healthy body weight or to lose weight. Ask what an ideal weight is for you. ?Get at least 30 minutes of exercise that causes your heart to beat faster (aerobic  exercise) most days of the week. Activities may include walking, swimming, or biking. ?Work with your health care provider or dietitian to adjust your eating plan to your individual calorie needs. ?What foods should I eat? ?Fruits ?All fresh, dried, or frozen fruit. Canned fruit in natural juice (without added sugar). ?Vegetables ?Fresh or frozen vegetables (raw, steamed, roasted, or grilled). Low-sodium or reduced-sodium tomato and vegetable juice. Low-sodium or reduced-sodium tomato sauce and tomato paste. Low-sodium or reduced-sodium canned  vegetables. ?Grains ?Whole-grain or whole-wheat bread. Whole-grain or whole-wheat pasta. Brown rice. [place]. Bulgur. Whole-grain and low-sodium cereals. Pita bread. Low-fat, low-sodium crackers. Whole-wheat flour tortillas. ?Meats and other proteins ?Skinless chicken or [place]. Ground chicken or [place]. Pork with fat trimmed off. Fish and seafood. Egg whites. Dried beans, peas, or lentils. Unsalted nuts, nut butters, and seeds. Unsalted canned beans. Lean cuts of beef with fat trimmed off. Low-sodium, lean precooked or cured meat, such as sausages or meat loaves. ?Dairy ?Low-fat (1%) or fat-free (skim) milk. Reduced-fat, low-fat, or fat-free cheeses. Nonfat, low-sodium ricotta or cottage cheese. Low-fat or nonfat yogurt. Low-fat, low-sodium cheese. ?Fats and oils ?Soft margarine without trans fats. Vegetable oil. Reduced-fat, low-fat, or light mayonnaise and salad dressings (reduced-sodium). Canola, safflower, olive, avocado, soybean, and sunflower oils. Avocado. ?Seasonings and condiments ?Herbs. Spices. Seasoning mixes without salt. ?Other foods ?Unsalted popcorn and pretzels. Fat-free sweets. ?The items listed above may not be a complete list of foods and beverages you can eat. Contact a dietitian for more information. ?What foods should I avoid? ?Fruits ?Canned fruit in a light or heavy syrup. Fried fruit. Fruit in cream or butter sauce. ?Vegetables ?Creamed or fried vegetables. Vegetables in a cheese sauce. Regular canned vegetables (not low-sodium or reduced-sodium). Regular canned tomato sauce and paste (not low-sodium or reduced-sodium). Regular tomato and vegetable juice (not low-sodium or reduced-sodium). Angie Fava. Olives. ?Grains ?Baked goods made with fat, such as croissants, muffins, or some breads. Dry pasta or rice meal packs. ?Meats and other proteins ?Fatty cuts of meat. Ribs. Fried meat. Berniece Salines. Bologna, salami, and other precooked or cured meats, such as sausages or meat loaves. Fat from  the back of a pig (fatback). Bratwurst. Salted nuts and seeds. Canned beans with added salt. Canned or smoked fish. Whole eggs or egg yolks. Chicken or [place] with skin. ?Dairy ?Whole or 2% milk, cream, and half-and-half. Whole or full-fat cream cheese. Whole-fat or sweetened yogurt. Full-fat cheese. Nondairy creamers. Whipped toppings. Processed cheese and cheese spreads. ?Fats and oils ?Butter. Stick margarine. Lard. Shortening. Ghee. Bacon fat. Tropical oils, such as coconut, palm kernel, or palm oil. ?Seasonings and condiments ?Onion salt, garlic salt, seasoned salt, table salt, and sea salt. Worcestershire sauce. Tartar sauce. Barbecue sauce. Teriyaki sauce. Soy sauce, including reduced-sodium. Steak sauce. Canned and packaged gravies. Fish sauce. Oyster sauce. Cocktail sauce. Store-bought horseradish. Ketchup. Mustard. Meat flavorings and tenderizers. Bouillon cubes. Hot sauces. Pre-made or packaged marinades. Pre-made or packaged taco seasonings. Relishes. Regular salad dressings. ?Other foods ?Salted popcorn and pretzels. ?The items listed above may not be a complete list of foods and beverages you should avoid. Contact a dietitian for more information. ?Where to find more information ?National Heart, Lung, and Blood Institute: https://wilson-eaton.com/ ?American Heart Association: www.heart.org ?Academy of Nutrition and Dietetics: www.eatright.org ?[place]: www.kidney.org ?Summary ?The DASH eating plan is a healthy eating plan that has been shown to reduce high blood pressure (hypertension). It may also reduce your risk for type 2 diabetes, heart disease, and stroke. ?When on the DASH  eating plan, aim to eat more fresh fruits and vegetables, whole grains, lean proteins, low-fat dairy, and heart-healthy fats. ?With the DASH eating plan, you should limit salt (sodium) intake to 2,300 mg a day. If you have hypertension, you may need to reduce your sodium intake to 1,500 mg a day. ?Work with your  health care provider or dietitian to adjust your eating plan to your individual calorie needs. ?This information is not intended to replace advice given to you by your health care provider. Make sure you dis

## 2021-11-08 NOTE — Assessment & Plan Note (Signed)
-   Basic Metabolic Panel ? ?Follow up: ? ?Follow up in 6 months or sooner if needed ?

## 2021-11-09 LAB — BASIC METABOLIC PANEL
BUN/Creatinine Ratio: 10 (ref 9–20)
BUN: 11 mg/dL (ref 6–24)
CO2: 25 mmol/L (ref 20–29)
Calcium: 9.3 mg/dL (ref 8.7–10.2)
Chloride: 99 mmol/L (ref 96–106)
Creatinine, Ser: 1.09 mg/dL (ref 0.76–1.27)
Glucose: 90 mg/dL (ref 70–99)
Potassium: 4.6 mmol/L (ref 3.5–5.2)
Sodium: 136 mmol/L (ref 134–144)
eGFR: 78 mL/min/{1.73_m2} (ref >59)

## 2021-12-08 ENCOUNTER — Other Ambulatory Visit: Payer: Self-pay

## 2021-12-12 ENCOUNTER — Other Ambulatory Visit: Payer: Self-pay

## 2021-12-13 ENCOUNTER — Other Ambulatory Visit: Payer: Self-pay

## 2021-12-14 ENCOUNTER — Other Ambulatory Visit: Payer: Self-pay

## 2022-01-17 ENCOUNTER — Other Ambulatory Visit: Payer: Self-pay

## 2022-02-13 ENCOUNTER — Other Ambulatory Visit: Payer: Self-pay

## 2022-03-20 ENCOUNTER — Other Ambulatory Visit: Payer: Self-pay

## 2022-04-23 ENCOUNTER — Other Ambulatory Visit: Payer: Self-pay

## 2022-05-10 ENCOUNTER — Ambulatory Visit: Payer: Medicaid Other | Admitting: Nurse Practitioner

## 2022-05-28 ENCOUNTER — Other Ambulatory Visit: Payer: Self-pay

## 2022-07-02 ENCOUNTER — Other Ambulatory Visit: Payer: Self-pay

## 2022-07-02 ENCOUNTER — Other Ambulatory Visit: Payer: Self-pay | Admitting: Cardiology

## 2022-07-02 DIAGNOSIS — I2111 ST elevation (STEMI) myocardial infarction involving right coronary artery: Secondary | ICD-10-CM

## 2022-07-02 DIAGNOSIS — I10 Essential (primary) hypertension: Secondary | ICD-10-CM

## 2022-07-03 ENCOUNTER — Other Ambulatory Visit: Payer: Self-pay

## 2022-07-04 ENCOUNTER — Other Ambulatory Visit: Payer: Self-pay

## 2022-07-04 MED ORDER — CARVEDILOL 6.25 MG PO TABS
6.2500 mg | ORAL_TABLET | Freq: Two times a day (BID) | ORAL | 3 refills | Status: DC
  Filled 2022-07-04: qty 60, 30d supply, fill #0
  Filled 2022-08-07: qty 60, 30d supply, fill #1
  Filled 2022-09-10: qty 60, 30d supply, fill #2
  Filled 2022-10-16 (×2): qty 60, 30d supply, fill #3
  Filled 2022-11-14: qty 60, 30d supply, fill #4
  Filled 2022-12-17: qty 60, 30d supply, fill #0
  Filled 2022-12-17: qty 60, 30d supply, fill #5
  Filled 2023-01-18: qty 60, 30d supply, fill #1
  Filled 2023-02-18: qty 60, 30d supply, fill #2
  Filled 2023-03-25: qty 60, 30d supply, fill #3
  Filled 2023-04-25: qty 60, 30d supply, fill #4
  Filled 2023-05-30 (×2): qty 60, 30d supply, fill #5
  Filled 2023-07-01: qty 60, 30d supply, fill #6

## 2022-07-04 MED ORDER — ATORVASTATIN CALCIUM 80 MG PO TABS
80.0000 mg | ORAL_TABLET | Freq: Every day | ORAL | 3 refills | Status: DC
  Filled 2022-07-04: qty 30, 30d supply, fill #0
  Filled 2022-08-07: qty 30, 30d supply, fill #1
  Filled 2022-09-10: qty 30, 30d supply, fill #2
  Filled 2022-10-16 (×2): qty 30, 30d supply, fill #3
  Filled 2022-11-14: qty 30, 30d supply, fill #4
  Filled 2022-12-17: qty 30, 30d supply, fill #5
  Filled 2022-12-17: qty 30, 30d supply, fill #0
  Filled 2023-01-18: qty 30, 30d supply, fill #1
  Filled 2023-02-18: qty 30, 30d supply, fill #2
  Filled 2023-03-25: qty 30, 30d supply, fill #3
  Filled 2023-04-25: qty 30, 30d supply, fill #4
  Filled 2023-05-30 (×2): qty 30, 30d supply, fill #5
  Filled 2023-07-01: qty 30, 30d supply, fill #6

## 2022-08-07 ENCOUNTER — Other Ambulatory Visit: Payer: Self-pay

## 2022-08-15 NOTE — Progress Notes (Signed)
Cardiology Office Note:    Date:  08/24/2022   ID:  Robert Benson, DOB 09-27-1960, MRN [id number]  PCP:  Vevelyn Francois, NP  Cardiologist:  Quashaun Lazalde Martinique, MD   Referring MD: Vevelyn Francois, NP   Chief Complaint  Patient presents with   Coronary Artery Disease     History of Present Illness:    Robert Benson is a 62 y.o. male seen for follow up CAD. He presented in September 2019 with an acute inferior STEMI. He was taken emergently to the Cath Lab.  Heart cath revealed RCA with a heavy thrombus burden treated with aspiration thrombectomy and DES.  LV gram with inferior wall motion abnormality and LVEF of 45-50%, confirmed with echocardiogram.  Grade 1 diastolic dysfunction.   Post intervention, he had nonsustained ventricular tachycardia.    On follow up today he is doing well. He has not resumed smoking since his heart attack. He denies chest pain, SOB, DOE, orthopnea, and lower extremity swelling. He is compliant on medications. He does a lot of yard work and has been walking more regularly.   Past Medical History:  Diagnosis Date   Arthritis    Hyperlipidemia    Hypertension    Kidney stone    STEMI (ST elevation myocardial infarction) ([place])    2019   Substance abuse ([place])    marijuana occasional    Past Surgical History:  Procedure Laterality Date   CORONARY/GRAFT ACUTE MI REVASCULARIZATION N/A 03/29/2018   Procedure: Coronary/Graft Acute MI Revascularization;  Surgeon: Martinique, Orvell Careaga M, MD;  Location: [facility];  Service: Cardiovascular;  Laterality: N/A;   HARDWARE REMOVAL Right 11/03/2014   Procedure: HARDWARE REMOVAL from middle finger;  Surgeon: Iran Planas, MD;  Location: [facility];  Service: Orthopedics;  Laterality: Right;   I & D EXTREMITY Right 08/23/2014   Procedure: EXPLORATION RIGHT HAND, THUMB INDEX, LONG AND RING FINGER, Index finger amputation, IP Fusion of Long Finger, ORIF of Thumb;  Surgeon: Iran Planas, MD;  Location: [facility];  Service: Orthopedics;   Laterality: Right;   LEFT HEART CATH AND CORONARY ANGIOGRAPHY N/A 03/29/2018   Procedure: LEFT HEART CATH AND CORONARY ANGIOGRAPHY;  Surgeon: Martinique, Riyanna Crutchley M, MD;  Location: [facility];  Service: Cardiovascular;  Laterality: N/A;   NERVE, TENDON AND ARTERY REPAIR Right 08/23/2014   Procedure: NERVE, TENDON, BONE AND ARTERY REPAIR AS NECESSARY;  Surgeon: Iran Planas, MD;  Location: [facility];  Service: Orthopedics;  Laterality: Right;   OPEN REDUCTION INTERNAL FIXATION (ORIF) HAND Right 08/23/2014   Procedure: OPEN REDUCTION INTERNAL FIXATION (ORIF) HAND;  Surgeon: Iran Planas, MD;  Location: [facility];  Service: Orthopedics;  Laterality: Right;    Current Medications: Current Meds  Medication Sig   aspirin 81 MG EC tablet Take 1 tablet (81 mg total) by mouth daily.   atorvastatin (LIPITOR) 80 MG tablet Take 1 tablet (80 mg total) by mouth daily.   carvedilol (COREG) 6.25 MG tablet Take 1 tablet (6.25 mg total) by mouth 2 (two) times daily with a meal.   nitroGLYCERIN (NITROSTAT) 0.4 MG SL tablet Place 1 tablet (0.4 mg total) under the tongue every 5 (five) minutes x 3 doses as needed for chest pain.     Allergies:   Morphine and related and Percocet [oxycodone-acetaminophen]   Social History   Socioeconomic History   Marital status: Married    Spouse name: Not on file   Number of children: Not on file   Years  of education: Not on file   Highest education level: Not on file  Occupational History   Occupation: roofer/construction  Tobacco Use   Smoking status: Former    Packs/day: 2.50    Years: 38.00    Total pack years: 95.00    Types: Cigarettes   Smokeless tobacco: Never   Tobacco comments:    quit 2019  Vaping Use   Vaping Use: Never used  Substance and Sexual Activity   Alcohol use: Yes    Comment: once weekly- liquor   Drug use: Yes    Types: Marijuana    Comment: uses occasionally   Sexual activity: Yes    Birth control/protection: None  Other Topics Concern   Not  on file  Social History Narrative   Not on file   Social Determinants of Health   Financial Resource Strain: Not on file  Food Insecurity: Not on file  Transportation Needs: Not on file  Physical Activity: Not on file  Stress: Not on file  Social Connections: Not on file     Family History: The patient's family history includes Hypertension in his father. There is no history of Colon cancer, Esophageal cancer, Rectal cancer, or Stomach cancer.  ROS:   Please see the history of present illness.    All other systems reviewed and are negative.  EKGs/Labs/Other Studies Reviewed:    The following studies were reviewed today:  Cath: 03/29/18   Prox RCA lesion is 99% stenosed. Post intervention, there is a 0% residual stenosis. A drug-eluting stent was successfully placed using a STENT SYNERGY DES 3.5X16. There is mild left ventricular systolic dysfunction. LV end diastolic pressure is normal. The left ventricular ejection fraction is 45-50% by visual estimate.   1. Single vessel occlusive CAD involving the proximal RCA with large thrombus burden. 2. Mild LV dysfunction with inferior wall motion abnormality 3. Normal LVEDP 4. Successful PCI of the RCA with aspiration thrombectomy and stenting with DES x 1.   Plan: DAPT for one year. IV Aggrastat for 18 hours. High dose statin. May be a candidate for fast track DC if no complications.    Recommend uninterrupted dual antiplatelet therapy with Aspirin 68m daily and Ticagrelor 930mtwice daily for a minimum of 12 months (ACS - Class I recommendation).    TTE: 03/31/18  Study Conclusions  - Left ventricle: The cavity size was normal. Systolic function was   mildly reduced. The estimated ejection fraction was in the range   of 45% to 50%. There is akinesis of the basal-midinferoseptal   myocardium. Doppler parameters are consistent with abnormal left   ventricular relaxation (grade 1 diastolic dysfunction). - Mitral valve: There  was moderate regurgitation  Echo 12/02/18: IMPRESSIONS      1. The left ventricle has mildly reduced systolic function, with an ejection fraction of 45-50%. The cavity size was normal. Left ventricular diastolic parameters were normal.  2. The right ventricle has normal systolic function. The cavity was normal.  3. The mitral valve is grossly normal.  4. The tricuspid valve is grossly normal.  5. The aortic valve is tricuspid. Mild thickening of the aortic valve. No stenosis of the aortic valve.  6. Severe hypokinesis of the basal inferior and inferolateral walls; overall mild LV dysfunction; mild MR.   EKG:  EKG is  ordered today.  NSR rate 59. Normal. I have personally reviewed and interpreted this study.   Recent Labs: 11/08/2021: BUN 11; Creatinine, Ser 1.09; Potassium 4.6; Sodium 136  Recent  Lipid Panel    Component Value Date/Time   CHOL 130 06/30/2021 1010   TRIG 109 06/30/2021 1010   HDL 40 06/30/2021 1010   CHOLHDL 3.3 06/30/2021 1010   CHOLHDL 3.4 03/30/2018 0233   VLDL 10 03/30/2018 0233   LDLCALC 70 06/30/2021 1010    Physical Exam:    VS:  BP 138/84 (BP Location: Right Arm, Patient Position: Sitting, Cuff Size: Normal)   Pulse (!) 59   Ht 5' 6"$  (1.676 m)   Wt 174 lb 6.4 oz (79.1 kg)   BMI 28.15 kg/m     Wt Readings from Last 3 Encounters:  08/24/22 174 lb 6.4 oz (79.1 kg)  11/08/21 169 lb 4 oz (76.8 kg)  06/30/21 160 lb 6.4 oz (72.8 kg)     GEN: Well nourished, well developed in no acute distress HEENT: Normal NECK: No JVD; No carotid bruits CARDIAC: RRR, no murmurs, rubs, gallops RESPIRATORY:  Clear to auscultation without rales, wheezing or rhonchi  ABDOMEN: Soft, non-tender, non-distended MUSCULOSKELETAL:  No edema; No deformity right radial C/D/I SKIN: Warm and dry NEUROLOGIC:  Alert and oriented x 3 PSYCHIATRIC:  Normal affect   ASSESSMENT:    1. Coronary artery disease involving native coronary artery of native heart without angina pectoris    2. Essential hypertension   3. Hypercholesterolemia      PLAN:    In order of problems listed above:  1. Coronary artery disease involving native coronary artery of native heart without angina pectoris  STEMI s/p thrombectomy and DES to RCA- September 14,2019 He is asymptomatic. Continue ASA, statin, and Coreg.   2. Essential Hypertension Well controlled on Coreg.   3. Hyperlipidemia, unspecified hyperlipidemia type - on high dose statin. Will update labs today.   4. Tobacco abuse He has quit smoking cigarettes in 2019   Follow up in one year.  Medication Adjustments/Labs and Tests Ordered: Current medicines are reviewed at length with the patient today.  Concerns regarding medicines are outlined above.  No orders of the defined types were placed in this encounter.   No orders of the defined types were placed in this encounter.    Signed, Christyne Mccain Martinique, MD  08/24/2022 9:11 AM    [place]

## 2022-08-24 ENCOUNTER — Ambulatory Visit: Payer: Self-pay | Attending: Cardiology | Admitting: Cardiology

## 2022-08-24 ENCOUNTER — Encounter: Payer: Self-pay | Admitting: Cardiology

## 2022-08-24 VITALS — BP 138/84 | HR 59 | Ht 66.0 in | Wt 174.4 lb

## 2022-08-24 DIAGNOSIS — I2510 Atherosclerotic heart disease of native coronary artery without angina pectoris: Secondary | ICD-10-CM

## 2022-08-24 DIAGNOSIS — I10 Essential (primary) hypertension: Secondary | ICD-10-CM

## 2022-08-24 DIAGNOSIS — E7800 Pure hypercholesterolemia, unspecified: Secondary | ICD-10-CM

## 2022-08-24 NOTE — Patient Instructions (Signed)
Medication Instructions:  Continue current medications  *If you need a refill on your cardiac medications before your next appointment, please call your pharmacy*   Lab Work: Fasting Lipid and CMP  If you have labs (blood work) drawn today and your tests are completely normal, you will receive your results only by: [place] (if you have MyChart) OR A paper copy in the mail If you have any lab test that is abnormal or we need to change your treatment, we will call you to review the results.   Testing/Procedures: None Ordered   Follow-Up: At [facility], you and your health needs are our priority.  As part of our continuing mission to provide you with exceptional heart care, we have created designated Provider Care Teams.  These Care Teams include your primary Cardiologist (physician) and Advanced Practice Providers (APPs -  Physician Assistants and Nurse Practitioners) who all work together to provide you with the care you need, when you need it.  We recommend signing up for the patient portal called "MyChart".  Sign up information is provided on this After Visit Summary.  MyChart is used to connect with patients for Virtual Visits (Telemedicine).  Patients are able to view lab/test results, encounter notes, upcoming appointments, etc.  Non-urgent messages can be sent to your provider as well.   To learn more about what you can do with MyChart, go to [email or web address].    Your next appointment:   1 year(s)  Provider:   Peter Martinique, MD     Other Instructions

## 2022-08-24 NOTE — Addendum Note (Signed)
Addended by: Vennie Homans on: 08/24/2022 09:18 AM   Modules accepted: Orders

## 2022-08-25 LAB — COMPREHENSIVE METABOLIC PANEL
ALT: 30 IU/L (ref 0–44)
AST: 24 IU/L (ref 0–40)
Albumin/Globulin Ratio: 1.7 (ref 1.2–2.2)
Albumin: 4 g/dL (ref 3.8–4.9)
Alkaline Phosphatase: 115 IU/L (ref 44–121)
BUN/Creatinine Ratio: 14 (ref 10–24)
BUN: 12 mg/dL (ref 8–27)
Bilirubin Total: 0.3 mg/dL (ref 0.0–1.2)
CO2: 26 mmol/L (ref 20–29)
Calcium: 8.7 mg/dL (ref 8.6–10.2)
Chloride: 103 mmol/L (ref 96–106)
Creatinine, Ser: 0.88 mg/dL (ref 0.76–1.27)
Globulin, Total: 2.3 g/dL (ref 1.5–4.5)
Glucose: 92 mg/dL (ref 70–99)
Potassium: 4.4 mmol/L (ref 3.5–5.2)
Sodium: 138 mmol/L (ref 134–144)
Total Protein: 6.3 g/dL (ref 6.0–8.5)
eGFR: 98 mL/min/{1.73_m2} (ref >59)

## 2022-08-25 LAB — LIPID PANEL
Chol/HDL Ratio: 2.7 ratio (ref 0.0–5.0)
Cholesterol, Total: 109 mg/dL (ref 100–199)
HDL: 41 mg/dL (ref >39)
LDL Chol Calc (NIH): 55 mg/dL (ref 0–99)
Triglycerides: 55 mg/dL (ref 0–149)
VLDL Cholesterol Cal: 13 mg/dL (ref 5–40)

## 2022-08-28 ENCOUNTER — Encounter: Payer: Self-pay | Admitting: *Deleted

## 2022-09-10 ENCOUNTER — Other Ambulatory Visit: Payer: Self-pay

## 2022-09-17 ENCOUNTER — Telehealth: Payer: Self-pay | Admitting: Licensed Clinical Social Worker

## 2022-09-17 NOTE — Progress Notes (Signed)
Heart and Vascular Care Navigation  09/17/2022  Robert Benson 05/29/1961 [id number]  Reason for Referral: uninsured Patient is participating in a Managed Medicaid Plan:No, thought he had active Medicaid/previously may have but currently no records in [place] with patient by telephone for initial visit for Heart and Vascular Care Coordination.                                                                                                   Assessment:                       LCSW was able to reach pt at [phone]. Introduced self, role, reason for call. Pt confirmed home address, PCP, emergency contact remains his wife. He requests I speak with her about current insurance. Phone was given to pt wife Mardene Celeste. Re-introduced self, role, reason for call. She shares that pt should have Medicaid. She thinks they got a letter about approval but isnt sure. I was able to pull up [place] but unable to locate any information about pt at this time- previously only had Family Planning. Encouraged them to go to DSS or call and see if he needs to recertify or reapply at this time. Otherwise no issues, no current cost of living concerns. Is able to obtain and afford his medications without issue at this time. No concerns with food, housing, utilities or transportation. Pt and pt wife okay with me reaching out for updates in a few weeks. If pt not eligible for Medicaid may qualify for [employer] .   HRT/VAS Care Coordination     Patients Home Cardiology Office [place] Team Social Worker   Social Worker Name: Westley Hummer, [place], [place]   Living arrangements for the past 2 months Single Family Home   Lives with: Spouse   Patient Current Insurance Coverage Self-Pay   Patient Has Concern With [place] Yes   Patient Concerns With Medical Bills currently no active insurance listed- regular medical care   Medical Bill Referrals: Medicaid  (DSS), [facility] Financial Assistance   Does Patient Have Prescription Coverage? No   Home Assistive Devices/Equipment None   DME Agency NA   [place] Agency NA       Social History:                                                                             SDOH Screenings   Food Insecurity: No Food Insecurity (09/17/2022)  Housing: Low Risk  (09/17/2022)  Transportation Needs: No Transportation Needs (09/17/2022)  Utilities: Not At Risk (09/17/2022)  Depression (PHQ2-9): Low Risk  (11/08/2021)  Financial Resource Strain: Low Risk  (09/17/2022)  Tobacco Use: Medium Risk (08/24/2022)    SDOH Interventions: Financial Resources:  Financial Strain Interventions: Other (  Comment) (is interested in reapplying/recertifying Medicaid)  Food Insecurity:  Food Insecurity Interventions: Intervention Not Indicated  Housing Insecurity:  Housing Interventions: Intervention Not Indicated  Transportation:   Transportation Interventions: Intervention Not Indicated   Other Care Navigation Interventions:     Provided Pharmacy assistance resources  Pt denies any issues affording/obtaining medications at this time- uses [facility]   Follow-up plan:   LCSW will f/u in the next two weeks to answer any additional questions/concerns and to receive any updates regarding Medicaid application.

## 2022-09-18 NOTE — Telephone Encounter (Signed)
H&V Care Navigation CSW Progress Note  Clinical Social Worker  received call from pt wife  to update me that she has gone to DSS to discuss Medicaid and pt has been re-enrolled. Per [place] coverage isnt updated yet but I will keep checking back. Encouraged her to reach out to billing department if they receive any bills that appear to not be covered. No additional questions at this time.    Patient is participating in a Managed Medicaid Plan:  Unclear- pt wife called to state Medicaid re-instated but I still do not see coverage uploaded yet on [place]: No Food Insecurity (09/17/2022)  Housing: [place]  (09/17/2022)  Transportation Needs: No Transportation Needs (09/17/2022)  Utilities: Not At Risk (09/17/2022)  Depression (PHQ2-9): Low Risk  (11/08/2021)  Financial Resource Strain: Low Risk  (09/17/2022)  Tobacco Use: Medium Risk (08/24/2022)   Westley Hummer, MSW, [place]  [phone]- work cell phone (preferred) [phone]- desk phone

## 2022-09-25 NOTE — Telephone Encounter (Signed)
H&V Care Navigation CSW Progress Note  Clinical Social Worker was able to verify that pt has [facility], recipient ID [id number] S. Remain available if pt has additional questions/concerns.   Patient is participating in a Managed Medicaid Plan:  Yes- [employer] Medicaid  [place]: No Food Insecurity (09/17/2022)  Housing: Low Risk  (09/17/2022)  Transportation Needs: No Transportation Needs (09/17/2022)  Utilities: Not At Risk (09/17/2022)  Depression (PHQ2-9): Low Risk  (11/08/2021)  Financial Resource Strain: Low Risk  (09/17/2022)  Tobacco Use: Medium Risk (08/24/2022)   Westley Hummer, MSW, [place]  [phone]- work cell phone (preferred) [phone]- desk phone

## 2022-10-16 ENCOUNTER — Other Ambulatory Visit: Payer: Self-pay

## 2022-11-14 ENCOUNTER — Other Ambulatory Visit: Payer: Self-pay

## 2022-12-17 ENCOUNTER — Other Ambulatory Visit (HOSPITAL_COMMUNITY): Payer: Self-pay

## 2022-12-17 ENCOUNTER — Other Ambulatory Visit: Payer: Self-pay

## 2023-01-18 ENCOUNTER — Other Ambulatory Visit: Payer: Self-pay

## 2023-01-18 ENCOUNTER — Other Ambulatory Visit (HOSPITAL_COMMUNITY): Payer: Self-pay

## 2023-02-18 ENCOUNTER — Other Ambulatory Visit: Payer: Self-pay

## 2023-02-18 ENCOUNTER — Other Ambulatory Visit (HOSPITAL_COMMUNITY): Payer: Self-pay

## 2023-03-25 ENCOUNTER — Other Ambulatory Visit: Payer: Self-pay

## 2023-04-25 ENCOUNTER — Other Ambulatory Visit: Payer: Self-pay

## 2023-04-25 ENCOUNTER — Other Ambulatory Visit (HOSPITAL_COMMUNITY): Payer: Self-pay

## 2023-04-29 ENCOUNTER — Other Ambulatory Visit: Payer: Self-pay

## 2023-05-30 ENCOUNTER — Other Ambulatory Visit (HOSPITAL_BASED_OUTPATIENT_CLINIC_OR_DEPARTMENT_OTHER): Payer: Self-pay

## 2023-05-30 ENCOUNTER — Other Ambulatory Visit (HOSPITAL_COMMUNITY): Payer: Self-pay

## 2023-07-01 ENCOUNTER — Other Ambulatory Visit: Payer: Self-pay

## 2023-08-01 ENCOUNTER — Other Ambulatory Visit: Payer: Self-pay

## 2023-08-01 ENCOUNTER — Other Ambulatory Visit: Payer: Self-pay | Admitting: Cardiology

## 2023-08-01 ENCOUNTER — Other Ambulatory Visit (HOSPITAL_COMMUNITY): Payer: Self-pay

## 2023-08-01 DIAGNOSIS — I2111 ST elevation (STEMI) myocardial infarction involving right coronary artery: Secondary | ICD-10-CM

## 2023-08-01 DIAGNOSIS — I10 Essential (primary) hypertension: Secondary | ICD-10-CM

## 2023-08-01 MED ORDER — ATORVASTATIN CALCIUM 80 MG PO TABS
80.0000 mg | ORAL_TABLET | Freq: Every day | ORAL | 0 refills | Status: DC
  Filled 2023-08-01 (×2): qty 90, 90d supply, fill #0

## 2023-08-01 MED ORDER — CARVEDILOL 6.25 MG PO TABS
6.2500 mg | ORAL_TABLET | Freq: Two times a day (BID) | ORAL | 0 refills | Status: DC
  Filled 2023-08-01 (×2): qty 180, 90d supply, fill #0

## 2023-08-02 ENCOUNTER — Other Ambulatory Visit (HOSPITAL_COMMUNITY): Payer: Self-pay

## 2023-08-02 ENCOUNTER — Encounter (HOSPITAL_COMMUNITY): Payer: Self-pay

## 2023-08-05 ENCOUNTER — Other Ambulatory Visit (HOSPITAL_COMMUNITY): Payer: Self-pay

## 2023-08-05 ENCOUNTER — Other Ambulatory Visit: Payer: Self-pay

## 2023-08-05 ENCOUNTER — Other Ambulatory Visit (HOSPITAL_BASED_OUTPATIENT_CLINIC_OR_DEPARTMENT_OTHER): Payer: Self-pay

## 2023-08-21 NOTE — Progress Notes (Signed)
Cardiology Office Note:    Date:  08/28/2023   ID:  Robert Benson, DOB May 22, 1961, MRN [id number]  PCP:  Ivonne Andrew, NP  Cardiologist:  Mariena Meares [place], MD   Referring MD: Ivonne Andrew, NP   No chief complaint on file.    History of Present Illness:    Robert Benson is a 63 y.o. male seen for follow up CAD. He presented in September 2019 with an acute inferior STEMI. He was taken emergently to the Cath Lab.  Heart cath revealed RCA with a heavy thrombus burden treated with aspiration thrombectomy and DES.  LV gram with inferior wall motion abnormality and LVEF of 45-50%, confirmed with echocardiogram.  Grade 1 diastolic dysfunction.   Post intervention, he had nonsustained ventricular tachycardia.    On follow up today he is doing well. He no longer smokes. Stays active. Denies any chest pain or SOB. No edema. Feels well.   Past Medical History:  Diagnosis Date   Arthritis    Hyperlipidemia    Hypertension    Kidney stone    STEMI (ST elevation myocardial infarction) (HCC)    2019   Substance abuse (HCC)    marijuana occasional    Past Surgical History:  Procedure Laterality Date   CORONARY/GRAFT ACUTE MI REVASCULARIZATION N/A 03/29/2018   Procedure: Coronary/Graft Acute MI Revascularization;  Surgeon: [place], Shinika Estelle M, MD;  Location: [facility] INVASIVE CV LAB;  Service: Cardiovascular;  Laterality: N/A;   HARDWARE REMOVAL Right 11/03/2014   Procedure: HARDWARE REMOVAL from middle finger;  Surgeon: Bradly Bienenstock, MD;  Location: [facility] OR;  Service: Orthopedics;  Laterality: Right;   I & D EXTREMITY Right 08/23/2014   Procedure: EXPLORATION RIGHT HAND, THUMB INDEX, LONG AND RING FINGER, Index finger amputation, IP Fusion of Long Finger, ORIF of Thumb;  Surgeon: Bradly Bienenstock, MD;  Location: [facility] OR;  Service: Orthopedics;  Laterality: Right;   LEFT HEART CATH AND CORONARY ANGIOGRAPHY N/A 03/29/2018   Procedure: LEFT HEART CATH AND CORONARY ANGIOGRAPHY;  Surgeon: [place], Honestee Revard M, MD;   Location: [facility] INVASIVE CV LAB;  Service: Cardiovascular;  Laterality: N/A;   NERVE, TENDON AND ARTERY REPAIR Right 08/23/2014   Procedure: NERVE, TENDON, BONE AND ARTERY REPAIR AS NECESSARY;  Surgeon: Bradly Bienenstock, MD;  Location: [facility] OR;  Service: Orthopedics;  Laterality: Right;   OPEN REDUCTION INTERNAL FIXATION (ORIF) HAND Right 08/23/2014   Procedure: OPEN REDUCTION INTERNAL FIXATION (ORIF) HAND;  Surgeon: Bradly Bienenstock, MD;  Location: [facility] OR;  Service: Orthopedics;  Laterality: Right;    Current Medications: Current Meds  Medication Sig   aspirin 81 MG EC tablet Take 1 tablet (81 mg total) by mouth daily.   nitroGLYCERIN (NITROSTAT) 0.4 MG SL tablet Place 1 tablet (0.4 mg total) under the tongue every 5 (five) minutes x 3 doses as needed for chest pain.   [DISCONTINUED] atorvastatin (LIPITOR) 80 MG tablet Take 1 tablet (80 mg total) by mouth daily.   [DISCONTINUED] carvedilol (COREG) 6.25 MG tablet Take 1 tablet (6.25 mg total) by mouth 2 (two) times daily with a meal.     Allergies:   Morphine and codeine and Percocet [oxycodone-acetaminophen]   Social History   Socioeconomic History   Marital status: Married    Spouse name: Not on file   Number of children: Not on file   Years of education: Not on file   Highest education level: Not on file  Occupational History   Occupation: roofer/construction  Tobacco Use   Smoking status:  Former    Current packs/day: 2.50    Average packs/day: 2.5 packs/day for 38.0 years (95.0 ttl pk-yrs)    Types: Cigarettes   Smokeless tobacco: Never   Tobacco comments:    quit 2019  Vaping Use   Vaping status: Never Used  Substance and Sexual Activity   Alcohol use: Yes    Comment: once weekly- liquor   Drug use: Yes    Types: Marijuana    Comment: uses occasionally   Sexual activity: Yes    Birth control/protection: None  Other Topics Concern   Not on file  Social History Narrative   Not on file   Social Drivers of Health   Financial  Resource Strain: Low Risk  (09/17/2022)   Overall Financial Resource Strain (CARDIA)    Difficulty of Paying Living Expenses: Not very hard  Food Insecurity: No Food Insecurity (09/17/2022)   Hunger Vital Sign    Worried About Running Out of Food in the Last Year: Never true    Ran Out of Food in the Last Year: Never true  Transportation Needs: No Transportation Needs (09/17/2022)   PRAPARE - Administrator, Civil Service (Medical): No    Lack of Transportation (Non-Medical): No  Physical Activity: Not on file  Stress: Not on file  Social Connections: Not on file     Family History: The patient's family history includes Hypertension in his father. There is no history of Colon cancer, Esophageal cancer, Rectal cancer, or Stomach cancer.  ROS:   Please see the history of present illness.    All other systems reviewed and are negative.  EKGs/Labs/Other Studies Reviewed:    The following studies were reviewed today:  Cath: 03/29/18   Prox RCA lesion is 99% stenosed. Post intervention, there is a 0% residual stenosis. A drug-eluting stent was successfully placed using a STENT SYNERGY DES 3.5X16. There is mild left ventricular systolic dysfunction. LV end diastolic pressure is normal. The left ventricular ejection fraction is 45-50% by visual estimate.   1. Single vessel occlusive CAD involving the proximal RCA with large thrombus burden. 2. Mild LV dysfunction with inferior wall motion abnormality 3. Normal LVEDP 4. Successful PCI of the RCA with aspiration thrombectomy and stenting with DES x 1.   Plan: DAPT for one year. IV Aggrastat for 18 hours. High dose statin. May be a candidate for fast track DC if no complications.    Recommend uninterrupted dual antiplatelet therapy with Aspirin 81mg  daily and Ticagrelor 90mg  twice daily for a minimum of 12 months (ACS - Class I recommendation).    TTE: 03/31/18  Study Conclusions  - Left ventricle: The cavity size was normal.  Systolic function was   mildly reduced. The estimated ejection fraction was in the range   of 45% to 50%. There is akinesis of the basal-midinferoseptal   myocardium. Doppler parameters are consistent with abnormal left   ventricular relaxation (grade 1 diastolic dysfunction). - Mitral valve: There was moderate regurgitation  Echo 12/02/18: IMPRESSIONS      1. The left ventricle has mildly reduced systolic function, with an ejection fraction of 45-50%. The cavity size was normal. Left ventricular diastolic parameters were normal.  2. The right ventricle has normal systolic function. The cavity was normal.  3. The mitral valve is grossly normal.  4. The tricuspid valve is grossly normal.  5. The aortic valve is tricuspid. Mild thickening of the aortic valve. No stenosis of the aortic valve.  6. Severe hypokinesis of  the basal inferior and inferolateral walls; overall mild LV dysfunction; mild MR.   EKG Interpretation Date/Time:  Wednesday August 28 2023 08:35:40 EST Ventricular Rate:  60 PR Interval:  134 QRS Duration:  82 QT Interval:  428 QTC Calculation: 428 R Axis:   1  Text Interpretation: Normal sinus rhythm Inferior infarct (cited on or before 29-Mar-2018) When compared with ECG of 31-Mar-2018 06:41, Premature atrial complexes are no longer Present ST no longer elevated in Inferior leads T wave inversion less evident in Inferior leads Nonspecific T wave abnormality no longer evident in Anterior leads Confirmed by [place], Chauncey Sciulli [phone]) on 08/28/2023 8:46:19 AM     Recent Labs: No results found for requested labs within last 365 days.  Recent Lipid Panel    Component Value Date/Time   CHOL 109 08/24/2022 0927   TRIG 55 08/24/2022 0927   HDL 41 08/24/2022 0927   CHOLHDL 2.7 08/24/2022 0927   CHOLHDL 3.4 03/30/2018 0233   VLDL 10 03/30/2018 0233   LDLCALC 55 08/24/2022 0927    Physical Exam:    VS:  BP 104/70 (BP Location: Right Arm, Patient Position: Sitting, Cuff  Size: Normal)   Pulse 60   Ht 5\' 7"  (1.702 m)   Wt 171 lb 12.8 oz (77.9 kg)   SpO2 95%   BMI 26.91 kg/m     Wt Readings from Last 3 Encounters:  08/28/23 171 lb 12.8 oz (77.9 kg)  08/24/22 174 lb 6.4 oz (79.1 kg)  11/08/21 169 lb 4 oz (76.8 kg)     GEN: Well nourished, well developed in no acute distress HEENT: Normal NECK: No JVD; No carotid bruits CARDIAC: RRR, no murmurs, rubs, gallops RESPIRATORY:  Clear to auscultation without rales, wheezing or rhonchi  ABDOMEN: Soft, non-tender, non-distended MUSCULOSKELETAL:  No edema; No deformity right radial C/D/I SKIN: Warm and dry NEUROLOGIC:  Alert and oriented x 3 PSYCHIATRIC:  Normal affect   ASSESSMENT:    1. Coronary artery disease involving native coronary artery of native heart without angina pectoris   2. Essential hypertension   3. Essential hypertension       PLAN:    In order of problems listed above:  1. Coronary artery disease s/p  STEMI s/p thrombectomy and DES to RCA- March 29, 2018 He is asymptomatic. Continue ASA, statin, and Coreg.   2. Essential Hypertension Well controlled.   3. Hyperlipidemia, on high dose lipitor. Will check labs today.   4. Tobacco abuse He has quit smoking cigarettes in 2019   Follow up in one year.  Medication Adjustments/Labs and Tests Ordered: Current medicines are reviewed at length with the patient today.  Concerns regarding medicines are outlined above.  Orders Placed This Encounter  Procedures   Basic metabolic panel   Lipid panel   Hepatic function panel   CBC w/Diff/Platelet   EKG 12-Lead    Meds ordered this encounter  Medications   carvedilol (COREG) 6.25 MG tablet    Sig: Take 1 tablet (6.25 mg total) by mouth 2 (two) times daily with a meal.    Dispense:  180 tablet    Refill:  3    Pt must keep upcoming appt in February 2025 with Dr. [place] before anymore refills. Thank you Final Attempt   atorvastatin (LIPITOR) 80 MG tablet    Sig: Take 1  tablet (80 mg total) by mouth daily.    Dispense:  90 tablet    Refill:  3    Pt must keep upcoming  appt in February 2025 with Dr. [place] before anymore refills. Thank you Final Attempt     Signed, Tonique Mendonca [place], MD  08/28/2023 8:52 AM     [facility]

## 2023-08-28 ENCOUNTER — Ambulatory Visit: Payer: Medicaid Other | Attending: Cardiology | Admitting: Cardiology

## 2023-08-28 ENCOUNTER — Encounter: Payer: Self-pay | Admitting: Cardiology

## 2023-08-28 VITALS — BP 104/70 | HR 60 | Ht 67.0 in | Wt 171.8 lb

## 2023-08-28 DIAGNOSIS — I10 Essential (primary) hypertension: Secondary | ICD-10-CM | POA: Diagnosis not present

## 2023-08-28 DIAGNOSIS — I2510 Atherosclerotic heart disease of native coronary artery without angina pectoris: Secondary | ICD-10-CM | POA: Diagnosis not present

## 2023-08-28 LAB — CBC WITH DIFFERENTIAL/PLATELET

## 2023-08-28 MED ORDER — ATORVASTATIN CALCIUM 80 MG PO TABS: 80.0000 mg | ORAL_TABLET | Freq: Every day | ORAL | 3 refills | Status: DC

## 2023-08-28 MED ORDER — CARVEDILOL 6.25 MG PO TABS: 6.2500 mg | ORAL_TABLET | Freq: Two times a day (BID) | ORAL | 3 refills | Status: DC

## 2023-08-28 NOTE — Patient Instructions (Signed)
Medication Instructions:  Continue same medications *If you need a refill on your cardiac medications before your next appointment, please call your pharmacy*   Lab Work: Bmet,cbc,lipid and hepatic panels today   Testing/Procedures: None ordered   Follow-Up: At [facility], you and your health needs are our priority.  As part of our continuing mission to provide you with exceptional heart care, we have created designated Provider Care Teams.  These Care Teams include your primary Cardiologist (physician) and Advanced Practice Providers (APPs -  Physician Assistants and Nurse Practitioners) who all work together to provide you with the care you need, when you need it.  We recommend signing up for the patient portal called "MyChart".  Sign up information is provided on this After Visit Summary.  MyChart is used to connect with patients for Virtual Visits (Telemedicine).  Patients are able to view lab/test results, encounter notes, upcoming appointments, etc.  Non-urgent messages can be sent to your provider as well.   To learn more about what you can do with MyChart, go to [email or web address].    Your next appointment:  1 year  Call in Oct to schedule Feb appointment     Provider:  Dr.Jordan

## 2023-08-29 LAB — BASIC METABOLIC PANEL
BUN/Creatinine Ratio: 14 (ref 10–24)
BUN: 13 mg/dL (ref 8–27)
CO2: 23 mmol/L (ref 20–29)
Calcium: 9.4 mg/dL (ref 8.6–10.2)
Chloride: 102 mmol/L (ref 96–106)
Creatinine, Ser: 0.92 mg/dL (ref 0.76–1.27)
Glucose: 98 mg/dL (ref 70–99)
Potassium: 4.4 mmol/L (ref 3.5–5.2)
Sodium: 139 mmol/L (ref 134–144)
eGFR: 95 mL/min/{1.73_m2} (ref >59)

## 2023-08-29 LAB — HEPATIC FUNCTION PANEL
ALT: 23 [IU]/L (ref 0–44)
AST: 19 [IU]/L (ref 0–40)
Albumin: 4.3 g/dL (ref 3.9–4.9)
Alkaline Phosphatase: 130 [IU]/L — ABNORMAL HIGH (ref 44–121)
Bilirubin Total: 1.1 mg/dL (ref 0.0–1.2)
Bilirubin, Direct: 0.35 mg/dL (ref 0.00–0.40)
Total Protein: 6.8 g/dL (ref 6.0–8.5)

## 2023-08-29 LAB — CBC WITH DIFFERENTIAL/PLATELET
Basos: 1 %
EOS (ABSOLUTE): 0 10*3/uL (ref 0.0–0.2)
Eos: 2 %
Hematocrit: 47 % (ref 37.5–51.0)
Hemoglobin: 15.9 g/dL (ref 13.0–17.7)
Immature Granulocytes: 0 %
Immature Granulocytes: 0 10*3/uL (ref 0.0–0.1)
Lymphs: 35 %
MCH: 30.9 pg (ref 26.6–33.0)
MCHC: 33.8 g/dL (ref 31.5–35.7)
MCV: 91 fL (ref 79–97)
Monocytes Absolute: 0.1 10*3/uL (ref 0.0–0.4)
Monocytes Absolute: 0.5 10*3/uL (ref 0.1–0.9)
Monocytes: 7 %
Neutrophils Absolute: 2.5 10*3/uL (ref 0.7–3.1)
Neutrophils Absolute: 3.9 10*3/uL (ref 1.4–7.0)
Neutrophils: 55 %
Platelets: 217 10*3/uL (ref 150–450)
RBC: 5.15 x10E6/uL (ref 4.14–5.80)
RDW: 12.5 % (ref 11.6–15.4)
WBC: 7.2 10*3/uL (ref 3.4–10.8)

## 2023-08-29 LAB — LIPID PANEL
Chol/HDL Ratio: 3.4 {ratio} (ref 0.0–5.0)
Cholesterol, Total: 118 mg/dL (ref 100–199)
HDL: 35 mg/dL — ABNORMAL LOW (ref >39)
LDL Chol Calc (NIH): 67 mg/dL (ref 0–99)
Triglycerides: 79 mg/dL (ref 0–149)
VLDL Cholesterol Cal: 16 mg/dL (ref 5–40)

## 2023-10-25 ENCOUNTER — Other Ambulatory Visit: Payer: Self-pay | Admitting: Cardiology

## 2023-10-25 ENCOUNTER — Other Ambulatory Visit: Payer: Self-pay

## 2023-10-25 ENCOUNTER — Other Ambulatory Visit (HOSPITAL_COMMUNITY): Payer: Self-pay

## 2023-10-25 DIAGNOSIS — I2111 ST elevation (STEMI) myocardial infarction involving right coronary artery: Secondary | ICD-10-CM

## 2023-10-25 DIAGNOSIS — I10 Essential (primary) hypertension: Secondary | ICD-10-CM

## 2023-10-25 MED ORDER — CARVEDILOL 6.25 MG PO TABS
6.2500 mg | ORAL_TABLET | Freq: Two times a day (BID) | ORAL | 3 refills | Status: AC
  Filled 2023-10-25 (×2): qty 180, 90d supply, fill #0
  Filled 2024-02-04: qty 180, 90d supply, fill #1
  Filled 2024-05-04: qty 180, 90d supply, fill #2
  Filled 2024-08-11: qty 180, 90d supply, fill #3

## 2023-10-25 MED ORDER — ATORVASTATIN CALCIUM 80 MG PO TABS
80.0000 mg | ORAL_TABLET | Freq: Every day | ORAL | 3 refills | Status: AC
  Filled 2023-10-25 (×2): qty 90, 90d supply, fill #0
  Filled 2024-02-04: qty 90, 90d supply, fill #1
  Filled 2024-05-04: qty 90, 90d supply, fill #2
  Filled 2024-08-11: qty 90, 90d supply, fill #3

## 2023-10-29 ENCOUNTER — Other Ambulatory Visit (HOSPITAL_COMMUNITY): Payer: Self-pay

## 2023-10-29 ENCOUNTER — Other Ambulatory Visit: Payer: Self-pay

## 2023-11-01 ENCOUNTER — Other Ambulatory Visit (HOSPITAL_COMMUNITY): Payer: Self-pay

## 2023-11-05 ENCOUNTER — Other Ambulatory Visit: Payer: Self-pay

## 2023-11-05 ENCOUNTER — Other Ambulatory Visit (HOSPITAL_COMMUNITY): Payer: Self-pay

## 2023-11-05 ENCOUNTER — Telehealth: Payer: Self-pay | Admitting: Cardiology

## 2023-11-05 MED ORDER — NITROGLYCERIN 0.4 MG SL SUBL
0.4000 mg | SUBLINGUAL_TABLET | SUBLINGUAL | 3 refills | Status: AC | PRN
  Filled 2023-11-05: qty 25, 8d supply, fill #0

## 2023-11-05 NOTE — Telephone Encounter (Signed)
 Patient identification verified by 2 forms. Hilton Lucky, RN    Called and spoke to patient  Informed patient Rx sent to pharmacy  Patient verbalized understanding, no questions at this time

## 2023-11-05 NOTE — Telephone Encounter (Signed)
*  STAT* If patient is at the pharmacy, call can be transferred to refill team.   1. Which medications need to be refilled? (please list name of each medication and dose if known) nitroGLYCERIN  (NITROSTAT ) 0.4 MG SL tablet    4. Which pharmacy/location (including street and city if local pharmacy) is medication to be sent to?    [facility] Phone: [phone]  Fax: [phone]       5. Do they need a 30 day or 90 day supply? 90

## 2024-02-04 ENCOUNTER — Other Ambulatory Visit (HOSPITAL_COMMUNITY): Payer: Self-pay

## 2024-05-04 ENCOUNTER — Other Ambulatory Visit (HOSPITAL_COMMUNITY): Payer: Self-pay

## 2024-08-11 ENCOUNTER — Other Ambulatory Visit (HOSPITAL_COMMUNITY): Payer: Self-pay

## 2024-08-31 ENCOUNTER — Ambulatory Visit: Admitting: Cardiology
